# Patient Record
Sex: Female | Born: 1975
Health system: Southern US, Community
[De-identification: ages and names within clinical notes are randomized; demographics above are authoritative.]

## PROBLEM LIST (undated history)

## (undated) ENCOUNTER — Inpatient Hospital Stay (HOSPITAL_COMMUNITY): Payer: Self-pay

## (undated) DIAGNOSIS — L719 Rosacea, unspecified: Secondary | ICD-10-CM

## (undated) DIAGNOSIS — R011 Cardiac murmur, unspecified: Secondary | ICD-10-CM

## (undated) DIAGNOSIS — Z9189 Other specified personal risk factors, not elsewhere classified: Secondary | ICD-10-CM

## (undated) DIAGNOSIS — E349 Endocrine disorder, unspecified: Secondary | ICD-10-CM

## (undated) DIAGNOSIS — D219 Benign neoplasm of connective and other soft tissue, unspecified: Secondary | ICD-10-CM

## (undated) DIAGNOSIS — R87619 Unspecified abnormal cytological findings in specimens from cervix uteri: Secondary | ICD-10-CM

## (undated) DIAGNOSIS — K5792 Diverticulitis of intestine, part unspecified, without perforation or abscess without bleeding: Secondary | ICD-10-CM

## (undated) DIAGNOSIS — K8020 Calculus of gallbladder without cholecystitis without obstruction: Secondary | ICD-10-CM

## (undated) DIAGNOSIS — IMO0002 Reserved for concepts with insufficient information to code with codable children: Secondary | ICD-10-CM

## (undated) DIAGNOSIS — G43109 Migraine with aura, not intractable, without status migrainosus: Secondary | ICD-10-CM

## (undated) DIAGNOSIS — N979 Female infertility, unspecified: Secondary | ICD-10-CM

## (undated) DIAGNOSIS — N946 Dysmenorrhea, unspecified: Secondary | ICD-10-CM

## (undated) HISTORY — DX: Dysmenorrhea, unspecified: N94.6

## (undated) HISTORY — DX: Migraine with aura, not intractable, without status migrainosus: G43.109

## (undated) HISTORY — DX: Female infertility, unspecified: N97.9

## (undated) HISTORY — DX: Other specified personal risk factors, not elsewhere classified: Z91.89

## (undated) HISTORY — PX: WISDOM TOOTH EXTRACTION: SHX21

## (undated) HISTORY — DX: Endocrine disorder, unspecified: E34.9

## (undated) HISTORY — PX: LAPAROSCOPY: SHX197

## (undated) HISTORY — PX: ABDOMINAL HYSTERECTOMY: SHX81

## (undated) HISTORY — DX: Diverticulitis of intestine, part unspecified, without perforation or abscess without bleeding: K57.92

## (undated) HISTORY — PX: LAPAROSCOPIC OVARIAN CYSTECTOMY: SUR786

## (undated) HISTORY — PX: OTHER SURGICAL HISTORY: SHX169

## (undated) HISTORY — PX: LEEP: SHX91

## (undated) HISTORY — DX: Cardiac murmur, unspecified: R01.1

## (undated) HISTORY — PX: CERVICAL BIOPSY  W/ LOOP ELECTRODE EXCISION: SUR135

## (undated) HISTORY — PX: MYOMECTOMY: SHX85

## (undated) HISTORY — DX: Calculus of gallbladder without cholecystitis without obstruction: K80.20

## (undated) HISTORY — DX: Rosacea, unspecified: L71.9

## (undated) HISTORY — PX: CHOLECYSTECTOMY: SHX55

## (undated) HISTORY — PX: DILATION AND CURETTAGE OF UTERUS: SHX78

## (undated) HISTORY — PX: HYSTEROSCOPY: SHX211

---

## 2007-01-03 ENCOUNTER — Emergency Department (HOSPITAL_COMMUNITY): Admission: EM | Admit: 2007-01-03 | Discharge: 2007-01-03 | Payer: Self-pay | Admitting: Family Medicine

## 2007-01-09 ENCOUNTER — Other Ambulatory Visit: Admission: RE | Admit: 2007-01-09 | Discharge: 2007-01-09 | Payer: Self-pay | Admitting: Obstetrics and Gynecology

## 2007-08-08 ENCOUNTER — Ambulatory Visit (HOSPITAL_COMMUNITY): Admission: RE | Admit: 2007-08-08 | Discharge: 2007-08-08 | Payer: Self-pay | Admitting: Gastroenterology

## 2007-08-15 ENCOUNTER — Ambulatory Visit (HOSPITAL_COMMUNITY): Admission: RE | Admit: 2007-08-15 | Discharge: 2007-08-16 | Payer: Self-pay | Admitting: General Surgery

## 2007-08-15 ENCOUNTER — Encounter (INDEPENDENT_AMBULATORY_CARE_PROVIDER_SITE_OTHER): Payer: Self-pay | Admitting: General Surgery

## 2008-03-04 ENCOUNTER — Other Ambulatory Visit: Admission: RE | Admit: 2008-03-04 | Discharge: 2008-03-04 | Payer: Self-pay | Admitting: Obstetrics and Gynecology

## 2008-07-10 ENCOUNTER — Ambulatory Visit: Payer: Self-pay | Admitting: Internal Medicine

## 2009-07-13 ENCOUNTER — Ambulatory Visit: Payer: Self-pay | Admitting: Internal Medicine

## 2009-07-13 DIAGNOSIS — J069 Acute upper respiratory infection, unspecified: Secondary | ICD-10-CM | POA: Insufficient documentation

## 2010-04-18 ENCOUNTER — Emergency Department (HOSPITAL_COMMUNITY): Admission: EM | Admit: 2010-04-18 | Discharge: 2010-04-18 | Payer: Self-pay | Admitting: Emergency Medicine

## 2010-04-27 ENCOUNTER — Ambulatory Visit (HOSPITAL_COMMUNITY)
Admission: RE | Admit: 2010-04-27 | Discharge: 2010-04-27 | Payer: Self-pay | Source: Home / Self Care | Admitting: Obstetrics & Gynecology

## 2010-05-07 ENCOUNTER — Ambulatory Visit (HOSPITAL_COMMUNITY)
Admission: RE | Admit: 2010-05-07 | Discharge: 2010-05-07 | Payer: Self-pay | Source: Home / Self Care | Admitting: Obstetrics & Gynecology

## 2010-06-27 ENCOUNTER — Encounter: Payer: Self-pay | Admitting: Gastroenterology

## 2010-07-07 NOTE — Assessment & Plan Note (Signed)
Summary: COUGH, CONGESTION // RS   Vital Signs:  Patient profile:   35 year old female Weight:      194 pounds O2 Sat:      96 % on Room air Temp:     98.5 degrees F oral BP sitting:   92 / 60  (right arm) Cuff size:   regular  Vitals Entered By: Raechel Ache, RN (July 13, 2009 4:20 PM)  O2 Flow:  Room air CC: congestion - chest and back , wet productive cough   CC:  congestion - chest and back  and wet productive cough.  History of Present Illness: 35 year old patient who presents with a 3-day history of right ear discomfort.  Head and chest congestion, and mildly productive cough.  Denies any fever, chills, shortness of breath or chest pain cough is productive of slightly yellow tinged sputum.  Denies any wheezing.  Allergies: No Known Drug Allergies  Past History:  Past Medical History: Reviewed history from 07/10/2008 and no changes required. unremarkable  Review of Systems       The patient complains of anorexia and prolonged cough.  The patient denies weight loss, weight gain, vision loss, decreased hearing, hoarseness, chest pain, syncope, dyspnea on exertion, peripheral edema, headaches, hemoptysis, abdominal pain, melena, hematochezia, severe indigestion/heartburn, hematuria, incontinence, genital sores, muscle weakness, suspicious skin lesions, transient blindness, difficulty walking, depression, unusual weight change, abnormal bleeding, enlarged lymph nodes, angioedema, and breast masses.    Physical Exam  General:  overweight-appearing.  no distress.  Low-normal blood pressure Head:  Normocephalic and atraumatic without obvious abnormalities. No apparent alopecia or balding. Eyes:  No corneal or conjunctival inflammation noted. EOMI. Perrla. Funduscopic exam benign, without hemorrhages, exudates or papilledema. Vision grossly normal. Ears:  External ear exam shows no significant lesions or deformities.  Otoscopic examination reveals clear canals, tympanic  membranes are intact bilaterally without bulging, retraction, inflammation or discharge. Hearing is grossly normal bilaterally. Nose:  External nasal examination shows no deformity or inflammation. Nasal mucosa are pink and moist without lesions or exudates. Mouth:  Oral mucosa and oropharynx without lesions or exudates.  Teeth in good repair. Lungs:  Normal respiratory effort, chest expands symmetrically. Lungs are clear to auscultation, no crackles or wheezes. Heart:  Normal rate and regular rhythm. S1 and S2 normal without gallop, murmur, click, rub or other extra sounds.   Impression & Recommendations:  Problem # 1:  URI (ICD-465.9)  Her updated medication list for this problem includes:    Hydrocodone-homatropine 5-1.5 Mg/4ml Syrp (Hydrocodone-homatropine) .Marland Kitchen... 1 teaspoon every 6 hours as needed for cough or congestion  Complete Medication List: 1)  Nuvaring 0.12-0.015 Mg/24hr Ring (Etonogestrel-ethinyl estradiol) 2)  Hydrocodone-homatropine 5-1.5 Mg/27ml Syrp (Hydrocodone-homatropine) .Marland Kitchen.. 1 teaspoon every 6 hours as needed for cough or congestion  Patient Instructions: 1)  Get plenty of rest, drink lots of clear liquids, and use Tylenol or Ibuprofen for fever and comfort. Return in 7-10 days if you're not better:sooner if you're feeling worse. Prescriptions: HYDROCODONE-HOMATROPINE 5-1.5 MG/5ML SYRP (HYDROCODONE-HOMATROPINE) 1 teaspoon every 6 hours as needed for cough or congestion  #6 oz x 1   Entered and Authorized by:   Gordy Savers  MD   Signed by:   Gordy Savers  MD on 07/13/2009   Method used:   Print then Give to Patient   RxID:   [id number]

## 2010-08-17 LAB — CBC
HCT: 41.8 % (ref 36.0–46.0)
Hemoglobin: 14.3 g/dL (ref 12.0–15.0)
MCH: 29.9 pg (ref 26.0–34.0)
MCHC: 34.2 g/dL (ref 30.0–36.0)
RDW: 13 % (ref 11.5–15.5)

## 2010-08-17 LAB — RHOGAM INJECTION: Unit division: 0

## 2010-08-20 ENCOUNTER — Encounter (HOSPITAL_COMMUNITY)
Admission: RE | Admit: 2010-08-20 | Discharge: 2010-08-20 | Disposition: A | Discharge status: Home / Self Care | Payer: Managed Care, Other (non HMO) | Source: Ambulatory Visit | Attending: Obstetrics and Gynecology | Admitting: Obstetrics and Gynecology

## 2010-08-20 DIAGNOSIS — Z01812 Encounter for preprocedural laboratory examination: Secondary | ICD-10-CM | POA: Insufficient documentation

## 2010-08-20 LAB — CBC
Hemoglobin: 14.5 g/dL (ref 12.0–15.0)
Platelets: 189 10*3/uL (ref 150–400)
RBC: 5 MIL/uL (ref 3.87–5.11)
WBC: 8.4 10*3/uL (ref 4.0–10.5)

## 2010-08-20 LAB — SURGICAL PCR SCREEN
MRSA, PCR: NEGATIVE
Staphylococcus aureus: NEGATIVE

## 2010-08-27 ENCOUNTER — Ambulatory Visit (HOSPITAL_COMMUNITY)
Admission: RE | Admit: 2010-08-27 | Discharge: 2010-08-27 | Disposition: A | Discharge status: Home / Self Care | Payer: Managed Care, Other (non HMO) | Source: Ambulatory Visit | Attending: Obstetrics and Gynecology | Admitting: Obstetrics and Gynecology

## 2010-08-27 DIAGNOSIS — Z01818 Encounter for other preprocedural examination: Secondary | ICD-10-CM | POA: Insufficient documentation

## 2010-08-27 DIAGNOSIS — N949 Unspecified condition associated with female genital organs and menstrual cycle: Secondary | ICD-10-CM | POA: Insufficient documentation

## 2010-08-27 DIAGNOSIS — N938 Other specified abnormal uterine and vaginal bleeding: Secondary | ICD-10-CM | POA: Insufficient documentation

## 2010-08-27 DIAGNOSIS — N803 Endometriosis of pelvic peritoneum, unspecified: Secondary | ICD-10-CM | POA: Insufficient documentation

## 2010-08-27 DIAGNOSIS — D251 Intramural leiomyoma of uterus: Secondary | ICD-10-CM | POA: Insufficient documentation

## 2010-08-27 DIAGNOSIS — Z01812 Encounter for preprocedural laboratory examination: Secondary | ICD-10-CM | POA: Insufficient documentation

## 2010-08-27 LAB — PREGNANCY, URINE: Preg Test, Ur: NEGATIVE

## 2010-08-30 ENCOUNTER — Other Ambulatory Visit: Payer: Self-pay | Admitting: Obstetrics and Gynecology

## 2010-10-19 NOTE — Op Note (Signed)
Gabrielle Gilbert, Gabrielle Gilbert               ACCOUNT NO.:  [id number]   MEDICAL RECORD NO.:  [id number]          PATIENT TYPE:  INP   LOCATION:  5151                         FACILITY:  [facility]   PHYSICIAN:  Sharlet Salina T. Hoxworth, M.D.DATE OF BIRTH:  03-14-1976   DATE OF PROCEDURE:  08/15/2007  DATE OF DISCHARGE:  08/16/2007                               OPERATIVE REPORT   PREOPERATIVE DIAGNOSIS:  Cholelithiasis.   POSTOPERATIVE DIAGNOSIS:  Cholelithiasis.   SURGICAL PROCEDURE:  Laparoscopic cholecystectomy with intraoperative  cholangiogram.   SURGEON:  Sharlet Salina T. Hoxworth, M.D.   ANESTHESIA:  General.   BRIEF HISTORY:  [name] is a 35 year old female who presents with  gradually worsening episodic epigastric and right upper quadrant  abdominal pain.  She had a workup showing multiple gallstones.  She is  felt to have symptomatic cholelithiasis.  Laparoscopic cholecystectomy  with cholangiogram has been recommend and accepted.  The nature of the  procedure, its indications, risks of bleeding, infection, bile leak and  bile duct injury were discussed and understood.  She is now brought to  operating room for this procedure.   DESCRIPTION OF OPERATION:  The patient was brought to the operating room  and placed in supine position on the operating table and general  endotracheal anesthesia was induced.  She received preoperative IV  antibiotics.  The abdomen was widely sterilely prepped and draped.  Correct patient and procedure were verified.  Local anesthesia was used  to infiltrate the trocar sites.  A 1-cm incision was made at the  umbilicus and dissection carried down to the midline fascia, which was  sharply incised for 1 cm and the peritoneum entered under direct vision.  Through a mattress suture of 0 Vicryl, the Hasson trocar was placed and  pneumoperitoneum established.  Under direct vision an 11-mm trocar was  placed in subxiphoid area and two 5-mm trocars on the right  subcostal  margin.  The gallbladder was visualized and did not appear acutely  inflamed.  The fundus was grasped and elevated up over the liver and the  infundibulum retracted inferolaterally.  Peritoneum anterior posterior  to Calot's triangle was incised and fibrofatty tissue was stripped off  the neck of the gallbladder toward the porta hepatis.  The distal  gallbladder and Calot's triangle were thoroughly dissected.  The cystic  duct was identified and the cystic duct dissected 360 agrees at its  junction with the gallbladder.  The cystic artery was clearly identified  in Calot's triangle.  When the anatomy was clear, the cystic duct was  clipped at the gallbladder junction and an operative cholangiogram was  obtained through the cystic duct with good visualization of the common  bile duct and intrahepatic ducts with free flow into the duodenum and no  filling defects.  Following this, the cholangiocatheter was removed, the  cystic duct triply clipped proximally and divided.  The cystic artery  was further clipped proximally and distally and divided.  The  gallbladder then dissected free from its bed using hook cautery and  removed through the umbilicus.  Complete hemostasis was  assured.  Trocars  removed under direct vision and all CO2 evacuated.  The mattress suture was secured to the umbilicus.  Skin incisions were  closed with subcuticular Monocryl and Dermabond.  Sponge, instrument and  needle counts were correct.  The patient was taken to the recovery room  in good condition.      Lorne Skeens. Hoxworth, M.D.  Electronically Signed     BTH/MEDQ  D:  08/15/2007  T:  08/16/2007  Job:  [id number]

## 2011-01-07 NOTE — Op Note (Addendum)
Gabrielle Gilbert, ABEND               ACCOUNT NO.:  [id number]  MEDICAL RECORD NO.:  [id number]           PATIENT TYPE:  O  LOCATION:  SDC                           FACILITY:  [facility]  PHYSICIAN:  Fermin Schwab, MD   DATE OF BIRTH:  27-Jan-1976  DATE OF PROCEDURE:  08/27/2010 DATE OF DISCHARGE:  08/20/2010                              OPERATIVE REPORT   PREOPERATIVE DIAGNOSES:  Transmural myoma, 5 cm abnormal uterine bleeding, recurrent pregnancy loss.  POSTOPERATIVE DIAGNOSES:  Transmural 5-cm myoma plus subserosal myoma, abnormal uterine bleeding, and recurrent pregnancy loss, stage I endometriosis and pelvic peritoneum.  PROCEDURE:  Laparoscopy, robotic assistance with myomectomy, excision and ablation of endometriosis.  SURGEON:  Fermin Schwab, MD  ASSISTANT:  Lum Keas, MD  FINDINGS:  On exam under anesthesia, the uterus was enlarged to 9-10 weeks size and was firm and mobile.  No adnexal masses were palpable.On laparoscopy, there were transverse colon adhesions to the liver edge. The liver itself was normal.  Diaphragm was normal.  The appendix appeared normal.  There was left iliac fossa, brown lesion of endometriosis as well as a constellation of stellate and brown lesions in the left side of the anterior cul-de-sac that were deep.  These were excised.  There were also posterior cul-de-sac brown lesions and clear lesions which were also ablated.  Both tubes appeared normal.  The uterus contained a fundal anterior 5 cm myoma with an attached 2 x 1 cm subserosal myoma.  There were other less than 1 cm subserosal myomas on the anterior surface of the uterus.  Both fallopian tubes appeared normal proximally and distally with healthy fimbria.  On chromotubation, the tubes were filled with dye and slowly passed slowly spilled.  Both ovaries appeared grossly normal.  The uterus sounded to 9 cm.  DESCRIPTION OF PROCEDURE:  The patient was placed in dorsal  supine position.  General anesthesia was given, 1 gram of Kefzol was given intravenously for prophylaxis.  She was prepped and draped in sterile manner in lithotomy position and a Foley catheter was inserted into the bladder.  A uterus sounded to 9 cm and Zumi manipulator was inserted into the uterus.  As balloon was inflated, this was used for uterine manipulation and chromotubation during the procedure.  A supraumbilical 12-mm skin incision was made with 2 incisions to the left that were 9 cm apart and below the level of this incision, each of which were 8 mm. Another incision was made symmetrically to the right of midline. Finally a fifth incision, 12 mm long was made in the right lower quadrant for accessory ports.  A Veress needle was inserted into the supraumbilical incision and its correct location was verified and pneumoperitoneum was created with CO2.  A 12-mm trocar was inserted and video laparoscopy was started.  The rest of the trocars were inserted under laparoscopic guidance.  The patient was placed in lithotomy and Trendelenburg position and the [employer] S robot was docked from the left side of the patient.  A tenaculum was attached to arm #3 and bipolar graspers were placed in #2 and monopolar scissors  were attached to arm #1.  A dilute vasopressin was injected into the myometrium until the uterus blanched (0.33 unit per mL x15 mL).  Anterior elliptical incision was made on the protruding aspect of the large myoma and rest of the myoma was carefully dissected and was noted to be adjacent to the anterior endometrium.  The endometrium was not entered.  The resulting defect was closed in 4 layers, first layer was a continuous interlocking suture of 2-0 PDS, second layer was a continuous suture of 2-0 PDS.  The gaping area of the superficial myometrium was closed with figure-of-eight and continuous suture of 3-0 PDS.  A continuous suture of 4-0 Vicryl was placed on the  serosa.  Adequate hemostasis was ensured.  The anterior cul-de-sac cluster of lesions were excised carefully with monopolar scissors and the rest of the endometriotic lesions were ablated with tip of monopolar scissors. Chromotubation ensured endometrial integrity of the interstitial portion of the tubes during the closure uterine repair process.  Estimated blood loss was 20 mL.  The probe out was undocked from the patient.  The instruments were removed.  The instrument count was correct.  Using a Lendell Caprice morcellator, the myoma was morcellated and pieces were removed.  The pelvis was copiously irrigated with lactated Ringer's and aspirated.  A slurry of Seprafilm (2 sheath and 40 mL) was instilled into the pelvis.  Large incisions were approximated with 0 Vicryl figure-of-eight sutures.  The subcutaneous layer of the 2 larger incisions were approximated with 4-0 Monocryl.  The skin was approximated with Dermabond.  Estimated blood loss was 28 mL.  The patient tolerated the procedure well and was transferred to recovery room in satisfactory condition.     Fermin Schwab, MD     TY/MEDQ  D:  08/27/2010  T:  08/28/2010  Job:  [id number]  cc:   M. Leda Quail, MD Fax: [phone]  Electronically Signed by Fermin Schwab MD on 01/07/2011 04:10:55 PM

## 2011-02-28 LAB — COMPREHENSIVE METABOLIC PANEL
ALT: 17
AST: 23
CO2: 26
Chloride: 111
Creatinine, Ser: 0.94
GFR calc Af Amer: >60
GFR calc non Af Amer: >60
Glucose, Bld: 91
Sodium: 140
Total Bilirubin: 0.7

## 2011-02-28 LAB — CBC
Hemoglobin: 11.4 — ABNORMAL LOW
MCV: 78.2
MCV: 78.6
Platelets: 206
RBC: 4.25
WBC: 6.3
WBC: 9.9

## 2011-02-28 LAB — DIFFERENTIAL
Eosinophils Absolute: 0.1
Lymphocytes Relative: 30
Lymphs Abs: 1.9
Neutro Abs: 3.8
Neutrophils Relative %: 61

## 2011-03-04 ENCOUNTER — Other Ambulatory Visit (HOSPITAL_COMMUNITY): Payer: Self-pay | Admitting: Obstetrics and Gynecology

## 2011-03-04 DIAGNOSIS — N979 Female infertility, unspecified: Secondary | ICD-10-CM

## 2011-03-09 ENCOUNTER — Ambulatory Visit (HOSPITAL_COMMUNITY): Admission: RE | Admit: 2011-03-09 | Payer: Managed Care, Other (non HMO) | Source: Ambulatory Visit

## 2011-03-14 ENCOUNTER — Ambulatory Visit (HOSPITAL_COMMUNITY): Payer: Managed Care, Other (non HMO)

## 2011-03-14 ENCOUNTER — Ambulatory Visit (HOSPITAL_COMMUNITY): Admission: RE | Admit: 2011-03-14 | Payer: Managed Care, Other (non HMO) | Source: Ambulatory Visit

## 2011-03-21 LAB — POCT URINALYSIS DIP (DEVICE)
Nitrite: NEGATIVE
Protein, ur: NEGATIVE
pH: 6

## 2011-03-21 LAB — WET PREP, GENITAL

## 2011-05-12 LAB — OB RESULTS CONSOLE HIV ANTIBODY (ROUTINE TESTING): HIV: NONREACTIVE

## 2011-05-12 LAB — OB RESULTS CONSOLE RPR: RPR: NONREACTIVE

## 2011-05-12 LAB — OB RESULTS CONSOLE ABO/RH: RH Type: NEGATIVE

## 2011-05-24 ENCOUNTER — Encounter (HOSPITAL_COMMUNITY): Payer: Self-pay

## 2011-05-24 ENCOUNTER — Inpatient Hospital Stay (HOSPITAL_COMMUNITY)
Admission: AD | Admit: 2011-05-24 | Discharge: 2011-05-24 | Disposition: A | Discharge status: Home / Self Care | Payer: Managed Care, Other (non HMO) | Source: Ambulatory Visit | Attending: Obstetrics and Gynecology | Admitting: Obstetrics and Gynecology

## 2011-05-24 ENCOUNTER — Inpatient Hospital Stay (HOSPITAL_COMMUNITY): Payer: Managed Care, Other (non HMO)

## 2011-05-24 DIAGNOSIS — O200 Threatened abortion: Secondary | ICD-10-CM | POA: Insufficient documentation

## 2011-05-24 HISTORY — DX: Unspecified abnormal cytological findings in specimens from cervix uteri: R87.619

## 2011-05-24 HISTORY — DX: Reserved for concepts with insufficient information to code with codable children: IMO0002

## 2011-05-24 HISTORY — DX: Benign neoplasm of connective and other soft tissue, unspecified: D21.9

## 2011-05-24 LAB — POCT PREGNANCY, URINE: Preg Test, Ur: POSITIVE

## 2011-05-24 LAB — URINALYSIS, ROUTINE W REFLEX MICROSCOPIC
Nitrite: NEGATIVE
Specific Gravity, Urine: 1.025 (ref 1.005–1.030)
Urobilinogen, UA: 0.2 mg/dL (ref 0.0–1.0)

## 2011-05-24 LAB — URINE MICROSCOPIC-ADD ON

## 2011-05-24 MED ORDER — RHO D IMMUNE GLOBULIN 1500 UNIT/2ML IJ SOLN
300.0000 ug | Freq: Once | INTRAMUSCULAR | Status: AC
  Administered 2011-05-24: 300 ug via INTRAMUSCULAR
  Filled 2011-05-24: qty 2

## 2011-05-24 NOTE — Progress Notes (Signed)
Pt states awoke this am, thought she was voiding in bed, noted blood. In toilet, passed large clot. Has had same pad on since then, bleeding slowed down. Last intercourse Thursday. Denies abnormal vaginal discharge. Hx prior miscarriages.

## 2011-05-24 NOTE — ED Provider Notes (Signed)
History     Chief Complaint  Patient presents with  . Vaginal Bleeding   HPI 35 yo [id number] MWF hx recurrent preg loss now @ 11 4/[redacted] wk gestation presents for evaluation of passage of clot and vaginal bleeding this am. Pt had episode of cramping x 1 . No recent intercourse. S/p myomectomy. On antibiotis for URI  OB History    Grav Para Term Preterm Abortions TAB SAB Ect Mult Living   5 1 1  0 3 0 3 0 0 1      Past Medical History  Diagnosis Date  . Fibroid   . Endometriosis   . Abnormal Pap smear     Past Surgical History  Procedure Date  . Myomectomy   . Dilation and curettage of uterus   . Cholecystectomy   . Laparoscopy   . Laparoscopic ovarian cystectomy   . Leep   . Wisdom tooth extraction     Family History  Problem Relation Age of Onset  . Anesthesia problems Neg Hx     History  Substance Use Topics  . Smoking status: Never Smoker   . Smokeless tobacco: Never Used  . Alcohol Use: No    Allergies: No Known Allergies  Prescriptions prior to admission  Medication Sig Dispense Refill  . acetaminophen (TYLENOL) 325 MG tablet Take 650 mg by mouth every 6 (six) hours as needed. For pain       . azithromycin (ZITHROMAX) 250 MG tablet Take 250-500 mg by mouth daily. Take 2 tablets on the first day, then take 1 tablet daily for the next 4 days       . prenatal vitamin w/FE, FA (PRENATAL 1 + 1) 27-1 MG TABS Take 1 tablet by mouth daily.          ROS neg except HPI Physical Exam   Blood pressure 112/54, pulse 60, temperature 98 F (36.7 C), temperature source Oral, resp. rate 16, height 5' 0.5" (1.537 m), weight 77.678 kg (171 lb 4 oz), last menstrual period 03/04/2011.  Physical Exam WD WN F in NAD Lungs clear to A Cor RRR ABD; Soft nontender healed upper abd scar Pelvic: Vulva - no blood Vaginal; small amt of scant dark red blood w/ small clot at os Cervix (+) ectropian closed/firm/long Uterus gravid 12 wk size (+) FHR Adnexa: nonpalp  MAU Course    Procedures ultrasound  MDM   Assessment and Plan  Threatened AB Recurrent preg loss (hx) IUP @ 11 4/7 weeks Rh neg P) OB sono. RHIG w/u. Rhophylac . Threatened AB prec. If ultrasound ok, keep sched OB appt 12/19  Dusti Tetro A 05/24/2011, 8:41 AM

## 2011-05-25 LAB — RH IG WORKUP (INCLUDES ABO/RH): Unit division: 0

## 2011-10-18 ENCOUNTER — Other Ambulatory Visit: Payer: Self-pay

## 2011-10-21 ENCOUNTER — Ambulatory Visit (HOSPITAL_COMMUNITY)
Admission: RE | Admit: 2011-10-21 | Discharge: 2011-10-21 | Disposition: A | Discharge status: Home / Self Care | Payer: Managed Care, Other (non HMO) | Source: Ambulatory Visit | Attending: Obstetrics and Gynecology | Admitting: Obstetrics and Gynecology

## 2011-11-06 ENCOUNTER — Encounter (HOSPITAL_COMMUNITY): Payer: Self-pay | Admitting: Obstetrics and Gynecology

## 2011-11-06 ENCOUNTER — Inpatient Hospital Stay (HOSPITAL_COMMUNITY)
Admission: AD | Admit: 2011-11-06 | Discharge: 2011-11-06 | Disposition: A | Discharge status: Home / Self Care | Payer: Managed Care, Other (non HMO) | Source: Ambulatory Visit | Attending: Obstetrics & Gynecology | Admitting: Obstetrics & Gynecology

## 2011-11-06 DIAGNOSIS — O4700 False labor before 37 completed weeks of gestation, unspecified trimester: Secondary | ICD-10-CM | POA: Insufficient documentation

## 2011-11-06 LAB — URINALYSIS, ROUTINE W REFLEX MICROSCOPIC
Leukocytes, UA: NEGATIVE
Nitrite: NEGATIVE
Protein, ur: NEGATIVE mg/dL
Specific Gravity, Urine: 1.01 (ref 1.005–1.030)
Urobilinogen, UA: 0.2 mg/dL (ref 0.0–1.0)

## 2011-11-06 MED ORDER — TERBUTALINE SULFATE 1 MG/ML IJ SOLN
0.2500 mg | Freq: Once | INTRAMUSCULAR | Status: AC
  Administered 2011-11-06: 0.25 mg via SUBCUTANEOUS
  Filled 2011-11-06: qty 1

## 2011-11-06 NOTE — Discharge Instructions (Signed)
Preterm labor precautions

## 2011-11-06 NOTE — Progress Notes (Signed)
Dr. Juliene Pina notified of patient arrival with complaints of abdominal cramping and brown vaginal discharge. Pt is 35w2 days; planned primary c-section. Tracing described to Dr. Juliene Pina with UI, lasting 10-20 secs. Orders received will notify Dr. Cherly Hensen that patient is here; Dr. Cherly Hensen is primary.

## 2011-11-06 NOTE — Progress Notes (Signed)
Dr. Cherly Hensen notified of patient arrival with complaints of lower abdominal pain and brown vaginal discharge. Dr. Katherine Basset with Dr. Camillia Herter plan of care.

## 2011-11-06 NOTE — MAU Provider Note (Signed)
Reviewed and agree with note V.Donney Caraveo, MD 

## 2011-11-06 NOTE — MAU Provider Note (Signed)
  History     CSN: [id number]  Arrival date and time: 11/06/11 0800 Call request to see patient - 0845  First Provider Initiated Contact with Patient 11/06/11 [phone]      Chief Complaint  Patient presents with  . Abdominal Cramping  . Vaginal Discharge   HPI  Ctx intermittently x 3-4 days / more vaginal pressure / NO LOF Some brown discharge since last week / + after intercourse Active FM  Past Medical History  Diagnosis Date  . Fibroid   . Endometriosis   . Abnormal Pap smear     Past Surgical History  Procedure Date  . Myomectomy   . Dilation and curettage of uterus   . Cholecystectomy   . Laparoscopy   . Laparoscopic ovarian cystectomy   . Leep   . Wisdom tooth extraction     Family History  Problem Relation Age of Onset  . Anesthesia problems Neg Hx     History  Substance Use Topics  . Smoking status: Never Smoker   . Smokeless tobacco: Never Used  . Alcohol Use: No    Allergies: No Known Allergies  Prescriptions prior to admission  Medication Sig Dispense Refill  . Prenatal Vit-Fe Fumarate-FA (PRENATAL MULTIVITAMIN) TABS Take 1 tablet by mouth every morning.        ROS Physical Exam   Blood pressure 119/66, pulse 81, temperature 97.2 F (36.2 C), temperature source Oral, resp. rate 18, last menstrual period 03/04/2011.  Physical Exam  Alert and oriented / NAD HR mild tachycardia secondary to terbutaline Abdomen soft / non-distended VE: closed / soft / >50% / vtx / -3 and ballotable  Urinalysis - negative  FHR category 1  Toco: no ctx since terbutaline injection  MAU Course  Procedures  Assessment and Plan  35 2/[redacted] weeks gestation Threatened PTL versus braxton-hicks No evidence of preterm labor HX myomectomy - planned CS  1) discharge home 2) labor precautions 3) ROB Tuesday as scheduled  Marlinda Mike 11/06/2011, 9:34 AM

## 2011-11-06 NOTE — MAU Note (Signed)
Pt presents to MAU with chief complaint of abdominal cramping and brown discharge that she noticed this morning. Pt is [redacted]w[redacted]d; G5P1, planned primary C-section for history of myomectomy (2012).

## 2011-11-17 ENCOUNTER — Other Ambulatory Visit: Payer: Self-pay | Admitting: Obstetrics and Gynecology

## 2011-11-25 ENCOUNTER — Encounter (HOSPITAL_COMMUNITY): Payer: Self-pay | Admitting: Pharmacist

## 2011-11-28 ENCOUNTER — Encounter (HOSPITAL_COMMUNITY): Payer: Self-pay | Admitting: *Deleted

## 2011-11-29 ENCOUNTER — Encounter (HOSPITAL_COMMUNITY): Payer: Self-pay

## 2011-11-29 ENCOUNTER — Encounter (HOSPITAL_COMMUNITY)
Admission: RE | Admit: 2011-11-29 | Discharge: 2011-11-29 | Disposition: A | Discharge status: Home / Self Care | Payer: Managed Care, Other (non HMO) | Source: Ambulatory Visit | Attending: Obstetrics and Gynecology | Admitting: Obstetrics and Gynecology

## 2011-11-29 LAB — CBC
HCT: 38.7 % (ref 36.0–46.0)
Hemoglobin: 13.7 g/dL (ref 12.0–15.0)
MCH: 30.5 pg (ref 26.0–34.0)
MCHC: 35.4 g/dL (ref 30.0–36.0)
MCV: 86.2 fL (ref 78.0–100.0)
Platelets: 156 10*3/uL (ref 150–400)
RBC: 4.49 MIL/uL (ref 3.87–5.11)
RDW: 13.4 % (ref 11.5–15.5)
WBC: 10.9 10*3/uL — ABNORMAL HIGH (ref 4.0–10.5)

## 2011-11-29 LAB — TYPE AND SCREEN: DAT, IgG: NEGATIVE

## 2011-11-29 LAB — SYPHILIS: RPR W/REFLEX TO RPR TITER AND TREPONEMAL ANTIBODIES, TRADITIONAL SCREENING AND DIAGNOSIS ALGORITHM: RPR Ser Ql: NONREACTIVE

## 2011-11-29 NOTE — Patient Instructions (Addendum)
20 Gabrielle Gilbert  11/29/2011   Your procedure is scheduled on:  12/02/11  Enter through the Main Entrance of [facility] at 6 AM.  Pick up the phone at the desk and dial [phone].   Call this number if you have problems the morning of surgery: [phone]   Remember:   Do not eat food:After Midnight.  Do not drink clear liquids: After Midnight.  Take these medicines the morning of surgery with A SIP OF WATER: NA    Do not wear jewelry, make-up or nail polish.  Do not wear lotions, powders, or perfumes. You may wear deodorant.  Do not shave 48 hours prior to surgery.  Do not bring valuables to the hospital.  Contacts, dentures or bridgework may not be worn into surgery.  Leave suitcase in the car. After surgery it may be brought to your room.  For patients admitted to the hospital, checkout time is 11:00 AM the day of discharge.   Patients discharged the day of surgery will not be allowed to drive home.  Name and phone number of your driver: NA  Special Instructions: CHG Shower Use Special Wash: 1/2 bottle night before surgery and 1/2 bottle morning of surgery.   Please read over the following fact sheets that you were given: MRSA Information

## 2011-12-01 MED ORDER — DEXTROSE 5 % IV SOLN
2.0000 g | INTRAVENOUS | Status: AC
  Administered 2011-12-02: 2 g via INTRAVENOUS
  Filled 2011-12-01: qty 2

## 2011-12-02 ENCOUNTER — Encounter (HOSPITAL_COMMUNITY): Payer: Self-pay | Admitting: *Deleted

## 2011-12-02 ENCOUNTER — Inpatient Hospital Stay (HOSPITAL_COMMUNITY): Payer: Managed Care, Other (non HMO) | Admitting: Anesthesiology

## 2011-12-02 ENCOUNTER — Inpatient Hospital Stay (HOSPITAL_COMMUNITY)
Admission: RE | Admit: 2011-12-02 | Discharge: 2011-12-04 | DRG: 765 | Disposition: A | Discharge status: Home / Self Care | Payer: Managed Care, Other (non HMO) | Source: Ambulatory Visit | Attending: Obstetrics and Gynecology | Admitting: Obstetrics and Gynecology

## 2011-12-02 ENCOUNTER — Encounter (HOSPITAL_COMMUNITY): Payer: Self-pay | Admitting: Anesthesiology

## 2011-12-02 ENCOUNTER — Encounter (HOSPITAL_COMMUNITY)
Admission: RE | Disposition: A | Discharge status: Home / Self Care | Payer: Self-pay | Source: Ambulatory Visit | Attending: Obstetrics and Gynecology

## 2011-12-02 DIAGNOSIS — Z01812 Encounter for preprocedural laboratory examination: Secondary | ICD-10-CM

## 2011-12-02 DIAGNOSIS — O34599 Maternal care for other abnormalities of gravid uterus, unspecified trimester: Secondary | ICD-10-CM | POA: Diagnosis present

## 2011-12-02 DIAGNOSIS — O3490 Maternal care for abnormality of pelvic organ, unspecified, unspecified trimester: Principal | ICD-10-CM | POA: Diagnosis present

## 2011-12-02 DIAGNOSIS — D4959 Neoplasm of unspecified behavior of other genitourinary organ: Secondary | ICD-10-CM | POA: Diagnosis present

## 2011-12-02 DIAGNOSIS — D62 Acute posthemorrhagic anemia: Secondary | ICD-10-CM | POA: Diagnosis not present

## 2011-12-02 DIAGNOSIS — O9903 Anemia complicating the puerperium: Secondary | ICD-10-CM | POA: Diagnosis not present

## 2011-12-02 DIAGNOSIS — D252 Subserosal leiomyoma of uterus: Secondary | ICD-10-CM | POA: Diagnosis present

## 2011-12-02 LAB — TYPE AND SCREEN: Antibody Screen: POSITIVE

## 2011-12-02 SURGERY — Surgical Case
Anesthesia: Spinal | Site: Abdomen | Wound class: Clean Contaminated

## 2011-12-02 MED ORDER — FENTANYL CITRATE 0.05 MG/ML IJ SOLN
INTRAMUSCULAR | Status: AC
  Filled 2011-12-02: qty 2

## 2011-12-02 MED ORDER — DIBUCAINE 1 % RE OINT: 1.0000 "application " | TOPICAL_OINTMENT | RECTAL | Status: DC | PRN

## 2011-12-02 MED ORDER — IBUPROFEN 600 MG PO TABS: 600.0000 mg | ORAL_TABLET | Freq: Four times a day (QID) | ORAL | Status: DC | PRN

## 2011-12-02 MED ORDER — KETOROLAC TROMETHAMINE 30 MG/ML IJ SOLN
30.0000 mg | Freq: Four times a day (QID) | INTRAMUSCULAR | Status: DC | PRN
  Administered 2011-12-02: 30 mg via INTRAVENOUS
  Filled 2011-12-02: qty 1

## 2011-12-02 MED ORDER — EPHEDRINE SULFATE 50 MG/ML IJ SOLN: INTRAMUSCULAR | Status: DC | PRN

## 2011-12-02 MED ORDER — TETANUS-DIPHTH-ACELL PERTUSSIS 5-2.5-18.5 LF-MCG/0.5 IM SUSP
0.5000 mL | Freq: Once | INTRAMUSCULAR | Status: AC
  Administered 2011-12-03: 0.5 mL via INTRAMUSCULAR
  Filled 2011-12-02: qty 0.5

## 2011-12-02 MED ORDER — PHENYLEPHRINE 40 MCG/ML (10ML) SYRINGE FOR IV PUSH (FOR BLOOD PRESSURE SUPPORT)
PREFILLED_SYRINGE | INTRAVENOUS | Status: AC
  Filled 2011-12-02: qty 15

## 2011-12-02 MED ORDER — OXYTOCIN 10 UNIT/ML IJ SOLN
INTRAMUSCULAR | Status: AC
  Filled 2011-12-02: qty 4

## 2011-12-02 MED ORDER — NALBUPHINE HCL 10 MG/ML IJ SOLN
5.0000 mg | INTRAMUSCULAR | Status: DC | PRN
  Administered 2011-12-02: 10 mg via SUBCUTANEOUS
  Filled 2011-12-02 (×2): qty 1

## 2011-12-02 MED ORDER — BUPIVACAINE HCL (PF) 0.25 % IJ SOLN
INTRAMUSCULAR | Status: AC
  Filled 2011-12-02: qty 30

## 2011-12-02 MED ORDER — SODIUM BICARBONATE 8.4 % IV SOLN: INTRAVENOUS | Status: DC | PRN

## 2011-12-02 MED ORDER — PHENYLEPHRINE HCL 10 MG/ML IJ SOLN
INTRAMUSCULAR | Status: DC | PRN
  Administered 2011-12-02 (×2): 80 ug via INTRAVENOUS
  Administered 2011-12-02: 120 ug via INTRAVENOUS
  Administered 2011-12-02 (×3): 80 ug via INTRAVENOUS

## 2011-12-02 MED ORDER — OXYCODONE-ACETAMINOPHEN 5-325 MG PO TABS
1.0000 | ORAL_TABLET | ORAL | Status: DC | PRN
  Filled 2011-12-02: qty 1

## 2011-12-02 MED ORDER — BUPIVACAINE IN DEXTROSE 0.75-8.25 % IT SOLN
INTRATHECAL | Status: DC | PRN
  Administered 2011-12-02: 1.5 mL via INTRATHECAL

## 2011-12-02 MED ORDER — SCOPOLAMINE 1 MG/3DAYS TD PT72
1.0000 | MEDICATED_PATCH | TRANSDERMAL | Status: DC
  Administered 2011-12-02: 1.5 mg via TRANSDERMAL

## 2011-12-02 MED ORDER — EPHEDRINE 5 MG/ML INJ
INTRAVENOUS | Status: AC
  Filled 2011-12-02: qty 10

## 2011-12-02 MED ORDER — POLYSACCHARIDE IRON COMPLEX 150 MG PO CAPS
150.0000 mg | ORAL_CAPSULE | Freq: Two times a day (BID) | ORAL | Status: DC
  Administered 2011-12-02 – 2011-12-04 (×4): 150 mg via ORAL
  Filled 2011-12-02 (×7): qty 1

## 2011-12-02 MED ORDER — PHENYLEPHRINE 40 MCG/ML (10ML) SYRINGE FOR IV PUSH (FOR BLOOD PRESSURE SUPPORT)
PREFILLED_SYRINGE | INTRAVENOUS | Status: AC
  Filled 2011-12-02: qty 5

## 2011-12-02 MED ORDER — ONDANSETRON HCL 4 MG/2ML IJ SOLN: 4.0000 mg | INTRAMUSCULAR | Status: DC | PRN

## 2011-12-02 MED ORDER — ONDANSETRON HCL 4 MG/2ML IJ SOLN: 4.0000 mg | Freq: Three times a day (TID) | INTRAMUSCULAR | Status: DC | PRN

## 2011-12-02 MED ORDER — LANOLIN HYDROUS EX OINT: 1.0000 "application " | TOPICAL_OINTMENT | CUTANEOUS | Status: DC | PRN

## 2011-12-02 MED ORDER — KETOROLAC TROMETHAMINE 60 MG/2ML IM SOLN
INTRAMUSCULAR | Status: AC
  Administered 2011-12-02: 60 mg via INTRAMUSCULAR
  Filled 2011-12-02: qty 2

## 2011-12-02 MED ORDER — OXYTOCIN 40 UNITS IN LACTATED RINGERS INFUSION - SIMPLE MED: 62.5000 mL/h | INTRAVENOUS | Status: AC

## 2011-12-02 MED ORDER — NALOXONE HCL 0.4 MG/ML IJ SOLN: 0.4000 mg | INTRAMUSCULAR | Status: DC | PRN

## 2011-12-02 MED ORDER — DIPHENHYDRAMINE HCL 25 MG PO CAPS: 25.0000 mg | ORAL_CAPSULE | ORAL | Status: DC | PRN

## 2011-12-02 MED ORDER — OXYTOCIN 10 UNIT/ML IJ SOLN
40.0000 [IU] | INTRAVENOUS | Status: DC | PRN
  Administered 2011-12-02: 40 [IU] via INTRAVENOUS

## 2011-12-02 MED ORDER — KETOROLAC TROMETHAMINE 30 MG/ML IJ SOLN: 30.0000 mg | Freq: Four times a day (QID) | INTRAMUSCULAR | Status: DC | PRN

## 2011-12-02 MED ORDER — FLEET ENEMA 7-19 GM/118ML RE ENEM: 1.0000 | ENEMA | Freq: Every day | RECTAL | Status: DC | PRN

## 2011-12-02 MED ORDER — DIPHENHYDRAMINE HCL 25 MG PO CAPS: 25.0000 mg | ORAL_CAPSULE | Freq: Four times a day (QID) | ORAL | Status: DC | PRN

## 2011-12-02 MED ORDER — HYDROMORPHONE HCL PF 1 MG/ML IJ SOLN: 0.2500 mg | INTRAMUSCULAR | Status: DC | PRN

## 2011-12-02 MED ORDER — BISACODYL 10 MG RE SUPP: 10.0000 mg | Freq: Every day | RECTAL | Status: DC | PRN

## 2011-12-02 MED ORDER — METOCLOPRAMIDE HCL 5 MG/ML IJ SOLN: 10.0000 mg | Freq: Three times a day (TID) | INTRAMUSCULAR | Status: DC | PRN

## 2011-12-02 MED ORDER — BUPIVACAINE HCL (PF) 0.25 % IJ SOLN
INTRAMUSCULAR | Status: DC | PRN
  Administered 2011-12-02: 5 mL

## 2011-12-02 MED ORDER — ONDANSETRON HCL 4 MG PO TABS: 4.0000 mg | ORAL_TABLET | ORAL | Status: DC | PRN

## 2011-12-02 MED ORDER — LACTATED RINGERS IV SOLN
INTRAVENOUS | Status: DC
  Administered 2011-12-02 (×2): via INTRAVENOUS

## 2011-12-02 MED ORDER — SIMETHICONE 80 MG PO CHEW
80.0000 mg | CHEWABLE_TABLET | Freq: Three times a day (TID) | ORAL | Status: DC
  Administered 2011-12-02 – 2011-12-04 (×6): 80 mg via ORAL

## 2011-12-02 MED ORDER — MORPHINE SULFATE (PF) 0.5 MG/ML IJ SOLN
INTRAMUSCULAR | Status: DC | PRN
  Administered 2011-12-02: .1 mg via INTRATHECAL

## 2011-12-02 MED ORDER — NALBUPHINE HCL 10 MG/ML IJ SOLN
5.0000 mg | INTRAMUSCULAR | Status: DC | PRN
  Administered 2011-12-02 (×2): 10 mg via INTRAVENOUS
  Filled 2011-12-02 (×2): qty 1

## 2011-12-02 MED ORDER — ONDANSETRON HCL 4 MG/2ML IJ SOLN
INTRAMUSCULAR | Status: DC | PRN
  Administered 2011-12-02 (×2): 2 mg via INTRAVENOUS

## 2011-12-02 MED ORDER — GLYCOPYRROLATE 0.2 MG/ML IJ SOLN
INTRAMUSCULAR | Status: DC | PRN
  Administered 2011-12-02: 0.2 mg via INTRAVENOUS

## 2011-12-02 MED ORDER — LACTATED RINGERS IV SOLN
INTRAVENOUS | Status: DC | PRN
  Administered 2011-12-02 (×2): via INTRAVENOUS

## 2011-12-02 MED ORDER — ONDANSETRON HCL 4 MG/2ML IJ SOLN
INTRAMUSCULAR | Status: AC
  Filled 2011-12-02: qty 2

## 2011-12-02 MED ORDER — NALOXONE HCL 0.4 MG/ML IJ SOLN
1.0000 ug/kg/h | INTRAMUSCULAR | Status: DC | PRN
  Filled 2011-12-02: qty 2.5

## 2011-12-02 MED ORDER — FENTANYL CITRATE 0.05 MG/ML IJ SOLN
INTRAMUSCULAR | Status: DC | PRN
  Administered 2011-12-02: 25 ug via INTRATHECAL

## 2011-12-02 MED ORDER — ZOLPIDEM TARTRATE 5 MG PO TABS: 5.0000 mg | ORAL_TABLET | Freq: Every evening | ORAL | Status: DC | PRN

## 2011-12-02 MED ORDER — NALBUPHINE SYRINGE 5 MG/0.5 ML
INJECTION | INTRAMUSCULAR | Status: AC
  Administered 2011-12-02: 10 mg via SUBCUTANEOUS
  Filled 2011-12-02: qty 1

## 2011-12-02 MED ORDER — SCOPOLAMINE 1 MG/3DAYS TD PT72: 1.0000 | MEDICATED_PATCH | Freq: Once | TRANSDERMAL | Status: DC

## 2011-12-02 MED ORDER — PRENATAL MULTIVITAMIN CH
1.0000 | ORAL_TABLET | Freq: Every day | ORAL | Status: DC
  Administered 2011-12-03 – 2011-12-04 (×2): 1 via ORAL
  Filled 2011-12-02 (×2): qty 1

## 2011-12-02 MED ORDER — SODIUM CHLORIDE 0.9 % IJ SOLN: 3.0000 mL | INTRAMUSCULAR | Status: DC | PRN

## 2011-12-02 MED ORDER — SCOPOLAMINE 1 MG/3DAYS TD PT72
MEDICATED_PATCH | TRANSDERMAL | Status: AC
  Administered 2011-12-02: 1.5 mg via TRANSDERMAL
  Filled 2011-12-02: qty 1

## 2011-12-02 MED ORDER — KETOROLAC TROMETHAMINE 60 MG/2ML IM SOLN
60.0000 mg | Freq: Once | INTRAMUSCULAR | Status: AC | PRN
  Administered 2011-12-02: 60 mg via INTRAMUSCULAR

## 2011-12-02 MED ORDER — SODIUM CHLORIDE 0.9 % IV SOLN: 250.0000 mL | INTRAVENOUS | Status: DC

## 2011-12-02 MED ORDER — CEFAZOLIN SODIUM 1-5 GM-% IV SOLN
1.0000 g | Freq: Three times a day (TID) | INTRAVENOUS | Status: AC
  Administered 2011-12-02 – 2011-12-03 (×2): 1 g via INTRAVENOUS
  Filled 2011-12-02 (×2): qty 50

## 2011-12-02 MED ORDER — MEPERIDINE HCL 25 MG/ML IJ SOLN: 6.2500 mg | INTRAMUSCULAR | Status: DC | PRN

## 2011-12-02 MED ORDER — METHYLERGONOVINE MALEATE 0.2 MG/ML IJ SOLN: 0.2000 mg | INTRAMUSCULAR | Status: DC | PRN

## 2011-12-02 MED ORDER — METHYLERGONOVINE MALEATE 0.2 MG PO TABS: 0.2000 mg | ORAL_TABLET | ORAL | Status: DC | PRN

## 2011-12-02 MED ORDER — DIPHENHYDRAMINE HCL 50 MG/ML IJ SOLN: 12.5000 mg | INTRAMUSCULAR | Status: DC | PRN

## 2011-12-02 MED ORDER — DIPHENHYDRAMINE HCL 50 MG/ML IJ SOLN: 25.0000 mg | INTRAMUSCULAR | Status: DC | PRN

## 2011-12-02 MED ORDER — IBUPROFEN 600 MG PO TABS: 600.0000 mg | ORAL_TABLET | Freq: Four times a day (QID) | ORAL | Status: DC

## 2011-12-02 MED ORDER — SODIUM CHLORIDE 0.9 % IJ SOLN: 3.0000 mL | Freq: Two times a day (BID) | INTRAMUSCULAR | Status: DC

## 2011-12-02 MED ORDER — EPHEDRINE SULFATE 50 MG/ML IJ SOLN
INTRAMUSCULAR | Status: DC | PRN
  Administered 2011-12-02 (×4): 5 mg via INTRAVENOUS

## 2011-12-02 MED ORDER — SENNOSIDES-DOCUSATE SODIUM 8.6-50 MG PO TABS
2.0000 | ORAL_TABLET | Freq: Every day | ORAL | Status: DC
  Administered 2011-12-02 – 2011-12-03 (×2): 2 via ORAL

## 2011-12-02 MED ORDER — MORPHINE SULFATE 0.5 MG/ML IJ SOLN
INTRAMUSCULAR | Status: AC
  Filled 2011-12-02: qty 10

## 2011-12-02 MED ORDER — IBUPROFEN 600 MG PO TABS
600.0000 mg | ORAL_TABLET | Freq: Four times a day (QID) | ORAL | Status: DC
  Administered 2011-12-02 – 2011-12-04 (×7): 600 mg via ORAL
  Filled 2011-12-02 (×7): qty 1

## 2011-12-02 MED ORDER — SIMETHICONE 80 MG PO CHEW: 80.0000 mg | CHEWABLE_TABLET | ORAL | Status: DC | PRN

## 2011-12-02 MED ORDER — MENTHOL 3 MG MT LOZG: 1.0000 | LOZENGE | OROMUCOSAL | Status: DC | PRN

## 2011-12-02 MED ORDER — WITCH HAZEL-GLYCERIN EX PADS: 1.0000 "application " | MEDICATED_PAD | CUTANEOUS | Status: DC | PRN

## 2011-12-02 SURGICAL SUPPLY — 46 items
BARRIER ADHS 3X4 INTERCEED (GAUZE/BANDAGES/DRESSINGS) ×2 IMPLANT
BENZOIN TINCTURE PRP APPL 2/3 (GAUZE/BANDAGES/DRESSINGS) IMPLANT
CHLORAPREP W/TINT 26ML (MISCELLANEOUS) ×2 IMPLANT
CLOTH BEACON ORANGE TIMEOUT ST (SAFETY) ×2 IMPLANT
CONTAINER PREFILL 10% NBF 15ML (MISCELLANEOUS) IMPLANT
DRESSING TELFA 8X3 (GAUZE/BANDAGES/DRESSINGS) ×2 IMPLANT
DRSG COVADERM 4X10 (GAUZE/BANDAGES/DRESSINGS) ×2 IMPLANT
ELECT REM PT RETURN 9FT ADLT (ELECTROSURGICAL) ×2
ELECTRODE REM PT RTRN 9FT ADLT (ELECTROSURGICAL) ×1 IMPLANT
EXTRACTOR VACUUM KIWI (MISCELLANEOUS) IMPLANT
EXTRACTOR VACUUM M CUP 4 TUBE (SUCTIONS) ×2 IMPLANT
GAUZE SPONGE 4X4 12PLY STRL LF (GAUZE/BANDAGES/DRESSINGS) ×4 IMPLANT
GLOVE BIO SURGEON STRL SZ 6.5 (GLOVE) ×2 IMPLANT
GLOVE BIOGEL PI IND STRL 6.5 (GLOVE) ×1 IMPLANT
GLOVE BIOGEL PI IND STRL 7.0 (GLOVE) ×2 IMPLANT
GLOVE BIOGEL PI INDICATOR 6.5 (GLOVE) ×1
GLOVE BIOGEL PI INDICATOR 7.0 (GLOVE) ×2
GLOVE ECLIPSE 6.0 STRL STRAW (GLOVE) ×2 IMPLANT
GOWN PREVENTION PLUS LG XLONG (DISPOSABLE) ×6 IMPLANT
KIT ABG SYR 3ML LUER SLIP (SYRINGE) IMPLANT
NEEDLE HYPO 25X1 1.5 SAFETY (NEEDLE) ×2 IMPLANT
NEEDLE HYPO 25X5/8 SAFETYGLIDE (NEEDLE) IMPLANT
NS IRRIG 1000ML POUR BTL (IV SOLUTION) ×4 IMPLANT
PACK C SECTION WH (CUSTOM PROCEDURE TRAY) ×2 IMPLANT
PAD ABD 7.5X8 STRL (GAUZE/BANDAGES/DRESSINGS) ×2 IMPLANT
PAD OB MATERNITY 4.3X12.25 (PERSONAL CARE ITEMS) ×2 IMPLANT
RTRCTR C-SECT PINK 25CM LRG (MISCELLANEOUS) IMPLANT
SLEEVE SCD COMPRESS KNEE MED (MISCELLANEOUS) IMPLANT
SPONGE GAUZE 4X4 12PLY (GAUZE/BANDAGES/DRESSINGS) ×2 IMPLANT
STAPLER VISISTAT 35W (STAPLE) ×2 IMPLANT
STRIP CLOSURE SKIN 1/2X4 (GAUZE/BANDAGES/DRESSINGS) IMPLANT
SUT CHROMIC GUT AB #0 18 (SUTURE) IMPLANT
SUT MNCRL 0 VIOLET CTX 36 (SUTURE) ×3 IMPLANT
SUT MON AB 4-0 PS1 27 (SUTURE) IMPLANT
SUT MONOCRYL 0 CTX 36 (SUTURE) ×3
SUT PLAIN 2 0 (SUTURE)
SUT PLAIN ABS 2-0 CT1 27XMFL (SUTURE) IMPLANT
SUT VIC AB 0 CT1 27 (SUTURE) ×2
SUT VIC AB 0 CT1 27XBRD ANBCTR (SUTURE) ×2 IMPLANT
SUT VIC AB 2-0 CT1 27 (SUTURE) ×1
SUT VIC AB 2-0 CT1 TAPERPNT 27 (SUTURE) ×1 IMPLANT
SYR CONTROL 10ML LL (SYRINGE) ×2 IMPLANT
TAPE CLOTH SURG 4X10 WHT LF (GAUZE/BANDAGES/DRESSINGS) ×2 IMPLANT
TOWEL OR 17X24 6PK STRL BLUE (TOWEL DISPOSABLE) ×4 IMPLANT
TRAY FOLEY CATH 14FR (SET/KITS/TRAYS/PACK) IMPLANT
WATER STERILE IRR 1000ML POUR (IV SOLUTION) ×2 IMPLANT

## 2011-12-02 NOTE — Anesthesia Procedure Notes (Signed)
Spinal  Patient location during procedure: OR Start time: 12/02/2011 7:30 AM Staffing Anesthesiologist: Brayton Caves R Performed by: anesthesiologist  Preanesthetic Checklist Completed: patient identified, site marked, surgical consent, pre-op evaluation, timeout performed, IV checked, risks and benefits discussed and monitors and equipment checked Spinal Block Patient position: sitting Prep: DuraPrep Patient monitoring: heart rate, cardiac monitor, continuous pulse ox and blood pressure Approach: midline Location: L3-4 Injection technique: single-shot Needle Needle type: Sprotte  Needle gauge: 24 G Needle length: 9 cm Assessment Sensory level: T4 Additional Notes Patient identified.  Risk benefits discussed including failed block, incomplete pain control, headache, nerve damage, paralysis, blood pressure changes, nausea, vomiting, reactions to medication both toxic or allergic, and postpartum back pain.  Patient expressed understanding and wished to proceed.  All questions were answered.  Sterile technique used throughout procedure.  CSF was clear.  No parasthesia or other complications.  Please see nursing notes for vital signs.

## 2011-12-02 NOTE — Brief Op Note (Signed)
12/02/2011  8:39 AM  PATIENT:  Gabrielle Gilbert  36 y.o. female  PRE-OPERATIVE DIAGNOSIS:  Previous myomectomy, TERM GESTATION  POST-OPERATIVE DIAGNOSIS:  Previous myomectomy, TERM GESTATION, SUBSEROSAL   PROCEDURE:  Procedure(s) (LRB):PRIMARY CESAREAN SECTION (N/A) KERR HYSTEROTOMY  SURGEON:  Surgeon(s) and Role:    * Md Smola Cathie Beams, MD - Primary  PHYSICIAN ASSISTANT:   ASSISTANTS: none  FINDINGS: LIVE FEMALE FLOATING VTX, NL TUBES AND OVARIES ANESTHESIA:   spinal  EBL:  Total I/O In: 2400 [I.V.:2400] Out: 1400 [Urine:300; Blood:1100]  BLOOD ADMINISTERED:none  DRAINS: none   LOCAL MEDICATIONS USED:  MARCAINE     SPECIMEN:  Source of Specimen:  placenta  DISPOSITION OF SPECIMEN:  N/A  COUNTS:  YES  TOURNIQUET:  * No tourniquets in log *  DICTATION: .Other Dictation: Dictation Number [id number]  PLAN OF CARE: Admit to inpatient   PATIENT DISPOSITION:  PACU - hemodynamically stable.   Delay start of Pharmacological VTE agent (>24hrs) due to surgical blood loss or risk of bleeding: no

## 2011-12-02 NOTE — Addendum Note (Signed)
Addendum  created 12/02/11 1706 by Shanon Payor, CRNA   Modules edited:Notes Section

## 2011-12-02 NOTE — Anesthesia Postprocedure Evaluation (Signed)
Anesthesia Post Note  Patient: Gabrielle Gilbert  Procedure(s) Performed: Procedure(s) (LRB): CESAREAN SECTION (N/A)  Anesthesia type: Spinal  Patient location: PACU  Post pain: Pain level controlled  Post assessment: Post-op Vital signs reviewed  Last Vitals:  Filed Vitals:   12/02/11 0845  BP: 130/98  Pulse:   Temp: 36.3 C  Resp: 16    Post vital signs: Reviewed  Level of consciousness: awake  Complications: No apparent anesthesia complications

## 2011-12-02 NOTE — Anesthesia Preprocedure Evaluation (Signed)
Anesthesia Evaluation  Patient identified by MRN, date of birth, ID band Patient awake    Reviewed: Allergy & Precautions, H&P , Patient's Chart, lab work & pertinent test results  Airway Mallampati: II TM Distance: >3 FB Neck ROM: full    Dental No notable dental hx.    Pulmonary  breath sounds clear to auscultation  Pulmonary exam normal       Cardiovascular Exercise Tolerance: Good Rhythm:regular Rate:Normal     Neuro/Psych    GI/Hepatic   Endo/Other    Renal/GU      Musculoskeletal   Abdominal   Peds  Hematology   Anesthesia Other Findings   Reproductive/Obstetrics                           Anesthesia Physical Anesthesia Plan  ASA: III  Anesthesia Plan: Spinal   Post-op Pain Management:    Induction:   Airway Management Planned:   Additional Equipment:   Intra-op Plan:   Post-operative Plan:   Informed Consent: I have reviewed the patients History and Physical, chart, labs and discussed the procedure including the risks, benefits and alternatives for the proposed anesthesia with the patient or authorized representative who has indicated his/her understanding and acceptance.   Dental Advisory Given  Plan Discussed with: CRNA  Anesthesia Plan Comments: (Lab work confirmed with CRNA in room. Platelets okay. Discussed spinal anesthetic, and patient consents to the procedure:  included risk of possible headache,backache, failed block, allergic reaction, and nerve injury. This patient was asked if she had any questions or concerns before the procedure started. )        Anesthesia Quick Evaluation  

## 2011-12-02 NOTE — Transfer of Care (Signed)
Immediate Anesthesia Transfer of Care Note  Patient: Gabrielle Gilbert  Procedure(s) Performed: Procedure(s) (LRB): CESAREAN SECTION (N/A)  Patient Location: PACU  Anesthesia Type: Spinal  Level of Consciousness: awake, alert  and oriented  Airway & Oxygen Therapy: Patient Spontanous Breathing  Post-op Assessment: Report given to PACU RN  Post vital signs: Reviewed and stable  Complications: No apparent anesthesia complications

## 2011-12-02 NOTE — Op Note (Signed)
Gabrielle Gilbert, Gabrielle Gilbert               ACCOUNT NO.:  [id number]  MEDICAL RECORD NO.:  [id number]  LOCATION:  9127                          FACILITY:  [facility]  PHYSICIAN:  Maxie Better, M.D.DATE OF BIRTH:  1975/11/22  DATE OF PROCEDURE:  12/02/2011 DATE OF DISCHARGE:                              OPERATIVE REPORT   PREOPERATIVE DIAGNOSES:  Previous myomectomy, term gestation.  PROCEDURES:   primary cesarean section, Kerr hysterotomy.  POSTOPERATIVE DIAGNOSES:  Previous myomectomy, subserosal fibroid, term gestation.  ANESTHESIA:  Spinal.  SURGEON:  Maxie Better, M.D.  ASSISTANT:  None.  PROCEDURE:  Under adequate spinal anesthesia, the patient was placed in the supine position with a left lateral tilt.  She was sterilely prepped and draped in the usual fashion.  An indwelling Foley catheter was sterilely placed.  0.25% Marcaine was injected along the planned Pfannenstiel skin incision site.  Pfannenstiel skin incision was then made, carried down to the rectus fascia.  Rectus fascia was opened transversely.  Rectus fascia then bluntly and sharply dissected off the rectus muscle in superior and inferior fashion.  The rectus muscle was split in midline.  The parietal peritoneum was entered bluntly and extended.  An undeveloped lower uterine segment was noted.  Some adhesions in the anterior lower uterine segment were noted.  The vesicouterine peritoneum was attempted to be opened transversely.  The bladder was still adherent.  Blunt dissection of the Bladder peritoneun off of the lower uterine segment was then done, and curvilinear low transverse uterine incision was then made and extended bluntly. Artificial rupture of membranes occurred.  The large venous sinus lakes was noted at the time of making the incision.  Subsequent attempt of the delivery of the vertex was unsuccessful resulting the use of vacuum for traction.  Subsequent delivery of a live female was  accomplished. Baby was bulb suctioned in the abdomen.  Cord was clamped and cut.  The baby was transferred to the awaiting pediatrician, who assigned Apgars of 8 and 9 at one and five minutes.  The placenta was manually removed, not sent to pathology.  Uterine cavity was cleaned of debris.  Uterine incision had no extension, was closed in 2 layers, the first layer was 0 Monocryl in running locked stitch, second layer was imbricating with 0 Monocryl suture.  Figure-of-eight suture was placed on the right for bleeder.  Small bleeding on the lower uterine segment was cauterized. Subsequently 3-0 Vicryl suture was placed x2 for good hemostasis in that area.  Normal tubes and ovaries were noted bilaterally.  There was a 3.5 cm anterior subserosal fibroid  noted.  Abdomen was copiously irrigated and suctioned the debris. The Interceed was placed over the uterine incision in an inverted T fashion. The parietal peritoneum was closed with 2-0 Vicryl suture.  The rectus fascia was closed with 0 Vicryl x2.  The subcutaneous area was irrigated, small bleeders cauterized.  Interrupted 2-0 plain sutures placed, and the skin approximated using Ethicon staples.  SPECIMEN:  Placenta, not sent to pathology.  ESTIMATED BLOOD LOSS:  1100 mL.  INTRAOPERATIVE FLUID:  2400 mL.  URINE:  200 mL clear yellow urine.  Sponge, instrument counts x2 was correct.  COMPLICATIONS:  None.  The patient tolerated the procedure well, was transferred to recovery room in stable condition. Baby weighed 8 lb 5 oz     Maxie Better, M.D.     [place]/[facility]  D:  12/02/2011  T:  12/02/2011  Job:  [id number]

## 2011-12-02 NOTE — Addendum Note (Signed)
Addendum  created 12/02/11 0920 by Velna Hatchet, MD   Modules edited:Anesthesia Blocks and Procedures, Anesthesia Medication Administration, Inpatient Notes

## 2011-12-02 NOTE — Addendum Note (Signed)
Addendum  created 12/02/11 0920 by Jaala Bohle R Estera Ozier, MD   Modules edited:Anesthesia Blocks and Procedures, Anesthesia Medication Administration, Inpatient Notes    

## 2011-12-02 NOTE — Progress Notes (Signed)
OOB to bathroom, steady on feet,

## 2011-12-02 NOTE — Anesthesia Postprocedure Evaluation (Signed)
  Anesthesia Post-op Note  Patient: Gabrielle Gilbert  Procedure(s) Performed: Procedure(s) (LRB): CESAREAN SECTION (N/A)  Patient Location: Mother/Baby  Anesthesia Type: Spinal  Level of Consciousness: awake, alert  and oriented  Airway and Oxygen Therapy: Patient Spontanous Breathing  Post-op Pain: none  Post-op Assessment: Post-op Vital signs reviewed and Patient's Cardiovascular Status Stable  Post-op Vital Signs: Reviewed and stable  Complications: No apparent anesthesia complications

## 2011-12-03 LAB — CBC
HCT: 28.4 % — ABNORMAL LOW (ref 36.0–46.0)
Hemoglobin: 9.9 g/dL — ABNORMAL LOW (ref 12.0–15.0)
MCHC: 34.9 g/dL (ref 30.0–36.0)
RDW: 13.6 % (ref 11.5–15.5)
WBC: 11.3 10*3/uL — ABNORMAL HIGH (ref 4.0–10.5)

## 2011-12-03 MED ORDER — RHO D IMMUNE GLOBULIN 1500 UNIT/2ML IJ SOLN
300.0000 ug | Freq: Once | INTRAMUSCULAR | Status: AC
  Administered 2011-12-03: 300 ug via INTRAMUSCULAR
  Filled 2011-12-03: qty 2

## 2011-12-03 NOTE — Progress Notes (Addendum)
POSTOPERATIVE DAY # 1 S/P cesarean section   S:         Reports feeling ok / + cramping              Tolerating po intake / no nausea / no vomiting / no flatus / no BM             Bleeding is light             Pain controlled with percocet and motrin             Up ad lib / ambulatory  Newborn breast feeding  / Circumcision planned   O:  A & O x 3 NAD / talkative             VS: Blood pressure 124/65, pulse 70, temperature 98 F (36.7 C), temperature source Oral, resp. rate 18, height 5' 0.5" (1.537 m), weight 88.451 kg (195 lb), last menstrual period 03/04/2011, SpO2 95.00%, unknown if currently breastfeeding.  LABS: WBC/Hgb/Hct/Plts:  11.3/9.9/28.4/129 (06/29 0500)   I&O:   + 1500~  Lungs: Clear and unlabored  Heart: regular rate and rhythm / no mumurs  Abdomen: soft, non-tender, non-distended, hypoactive bowel sounds             Fundus: firm, non-tender, Ueven             Dressing intact              Lochia: light  Extremities: no edema, no calf pain or tenderness  A:        POD # 1 S/P cesarean section            Mild acute blood loss anemia  P:        Routine postoperative care - advance activity             DC IV / shower and remove dressing today              Iron supplement     Marlinda Mike CNM, MSN 12/03/2011, 9:26 AM

## 2011-12-04 ENCOUNTER — Encounter (HOSPITAL_COMMUNITY): Payer: Self-pay | Admitting: Obstetrics and Gynecology

## 2011-12-04 LAB — RH IG WORKUP (INCLUDES ABO/RH)
ABO/RH(D): A NEG
Fetal Screen: NEGATIVE
Gestational Age(Wks): 39
Unit division: 0

## 2011-12-04 MED ORDER — POLYSACCHARIDE IRON COMPLEX 150 MG PO CAPS: 150.0000 mg | ORAL_CAPSULE | Freq: Two times a day (BID) | ORAL | Status: DC

## 2011-12-04 MED ORDER — IBUPROFEN 600 MG PO TABS: 600.0000 mg | ORAL_TABLET | Freq: Four times a day (QID) | ORAL | Status: AC

## 2011-12-04 MED ORDER — OXYCODONE-ACETAMINOPHEN 5-325 MG PO TABS: 1.0000 | ORAL_TABLET | ORAL | Status: AC | PRN

## 2011-12-04 NOTE — Discharge Summary (Signed)
POSTOPERATIVE DISCHARGE SUMMARY:  Patient ID: Gabrielle Gilbert MRN: [id number] DOB/AGE: 1976-03-01 36 y.o.  Admit date: 12/02/2011 Discharge date:  12/04/2011  Admission Diagnoses:  previous myomectomy  Discharge Diagnoses:   Term Pregnancy-delivered  POD 2 s/p cesarean section  Mild acute blood loss anemia  Prenatal history: [id number]   EDC : 12/09/2011, by Last Menstrual Period  Prenatal care at [facility]  Primary provider : Cousins Prenatal course complicated by hx myomectomy - cesarean delivery planned  Prenatal Labs: ABO, Rh: A (12/06 0000) negative Antibody: negative Rubella: Immune (12/06 0000)  RPR: NON REACTIVE (06/25 0902)  HBsAg: Negative (12/06 0000)  HIV: Non-reactive (12/06 0000)  1 hr Glucola : NL   Medical / Surgical History :  Past medical history:  Past Medical History  Diagnosis Date  . Fibroid   . Endometriosis   . Abnormal Pap smear     Past surgical history:  Past Surgical History  Procedure Date  . Myomectomy   . Dilation and curettage of uterus   . Cholecystectomy   . Laparoscopy   . Laparoscopic ovarian cystectomy   . Leep   . Wisdom tooth extraction     Family History:  Family History  Problem Relation Age of Onset  . Anesthesia problems Neg Hx     Social History:  reports that she has never smoked. She has never used smokeless tobacco. She reports that she does not drink alcohol or use illicit drugs.   Allergies: Review of patient's allergies indicates no known allergies.    Current Medications at time of admission:  Prior to Admission medications   Medication Sig Start Date End Date Taking? Authorizing Provider  Prenatal Vit-Fe Fumarate-FA (PRENATAL MULTIVITAMIN) TABS Take 1 tablet by mouth every morning.   Yes Historical Provider, MD  zolpidem (AMBIEN) 10 MG tablet Take 5 mg by mouth at bedtime as needed. For sleep   Yes Historical Provider, MD  acetaminophen (TYLENOL) 500 MG tablet Take 1,000 mg by  mouth every 6 (six) hours as needed. For headache    Historical Provider, MD    Procedures: Cesarean section delivery of Female newborn by Dr Cherly Hensen  See operative report for further details  Postoperative / postpartum course: uneventful - discharge on POD 2 in stable condition  Physical Exam:   VSS: Blood pressure 134/79, pulse 76, temperature 97.6 F (36.4 C), temperature source Oral, resp. rate 18, height 5' 0.5" (1.537 m), weight 88.451 kg (195 lb), last menstrual period 03/04/2011, SpO2 95.00%, unknown if currently breastfeeding.  LABS:   preop hgb 13 / postop hgb 9.9 General:pleasant / NAD or pain Heart:RR Lungs: unlabored / clear Abdomen: soft / non-distended / active bowel sounds Extremities: no edema  Incision:  approximated with staples / no erythema / no ecchymosis / no drainage Staples: intact for removal on POD 4 at [facility]  Discharge Instructions:  Discharged Condition: stable Activity: pelvic rest and postoperative restrictions x 2 weeks Diet: routine Medications: see below Medication List  As of 12/04/2011 11:39 AM   TAKE these medications         acetaminophen 500 MG tablet   Commonly known as: TYLENOL   Take 1,000 mg by mouth every 6 (six) hours as needed. For headache      ibuprofen 600 MG tablet   Commonly known as: ADVIL,MOTRIN   Take 1 tablet (600 mg total) by mouth every 6 (six) hours.      iron polysaccharides 150 MG capsule   Commonly known  as: NIFEREX   Take 1 capsule (150 mg total) by mouth 2 (two) times daily.      oxyCODONE-acetaminophen 5-325 MG per tablet   Commonly known as: PERCOCET   Take 1 tablet by mouth every 4 (four) hours as needed (moderate - severe pain).      prenatal multivitamin Tabs   Take 1 tablet by mouth every morning.      zolpidem 10 MG tablet   Commonly known as: AMBIEN   Take 5 mg by mouth at bedtime as needed. For sleep           Condition: stable Postpartum Instructions: refer to practice specific  booklet Discharge to: home Disposition: 01-Home or Self Care Follow up :  Follow-up Information    Follow up with COUSINS,SHERONETTE A, MD. Schedule an appointment as soon as possible for a visit in 6 weeks. (staple removal Tuesday - call for apt time)    Contact information:   [address] [place] [address] [phone]           Signed: Marlinda Mike [facility] 12/04/2011, 11:39 AM

## 2011-12-04 NOTE — Progress Notes (Signed)
POSTOPERATIVE DAY # 2 S/P cesarean section   S:         Reports feeling well - ready for discharge today             Tolerating po intake / no nausea / no  vomiting / + flatus / no BM             Bleeding is light             Pain controlled withprescription NSAID's including motrin             Up ad lib / ambulatory  Newborn breast-feeding  / Circumcision done   O:  A & O x 3 NAD             VS: Blood pressure 134/79, pulse 76, temperature 97.6 F (36.4 C), temperature source Oral, resp. rate 18, height 5' 0.5" (1.537 m), weight 88.451 kg (195 lb), last menstrual period 03/04/2011, SpO2 95.00%, unknown if currently breastfeeding.  Lungs: Clear and unlabored  Heart: regular rate and rhythm   Abdomen: soft, non-tender, non-distended, active BS             Fundus: firm, non-tender, U-1             Dressing OFF             Incision:  approximated with staples / no erythema / no ecchymosis / no drainage but damp under panus  Lochia: light  Extremities: trace edema, no calf pain or tenderness  A:        POD # 2 S/P cesarean section            Stable for early discharge  P:        Routine postoperative care              Discharge to home             [facility] booklet - instructions reviewed / staple removal at [facility] Tuesday     Marlinda Mike CNM, MSN 12/04/2011, 11:34 AM

## 2012-01-22 NOTE — Progress Notes (Signed)
MFM Note  Gabrielle Gilbert is a 36 year old [id number] Caucasian female at [redacted] weeks gestation who presents for consultation and management of an increasing anti-D antibody titer. We reviewed her pregnancies, anti-D titers and her Rhophylac records. Then, we had a discussion regarding the pathophysiology of alloimmunization. Ms. Heiner was present and began calling to find out his blood type. He was told he had Rh negative blood. They understood that is was crucial to the health of their fetus that the actual lab result be reviewed and confirmed and that paternity could not be in question. If Mr. Menees is truly Rh -, then the fetus is not at risk. They were offered cffDNA for fetal RH status and declined.  Thank you for the kind referral.  (Face-to-face consultation with patient: 20 min)

## 2012-05-01 DIAGNOSIS — Z0000 Encounter for general adult medical examination without abnormal findings: Secondary | ICD-10-CM | POA: Insufficient documentation

## 2012-12-24 ENCOUNTER — Emergency Department (INDEPENDENT_AMBULATORY_CARE_PROVIDER_SITE_OTHER): Payer: Managed Care, Other (non HMO)

## 2012-12-24 ENCOUNTER — Encounter (HOSPITAL_COMMUNITY): Payer: Self-pay | Admitting: *Deleted

## 2012-12-24 ENCOUNTER — Emergency Department (INDEPENDENT_AMBULATORY_CARE_PROVIDER_SITE_OTHER)
Admission: EM | Admit: 2012-12-24 | Discharge: 2012-12-24 | Disposition: A | Discharge status: Home / Self Care | Payer: Managed Care, Other (non HMO) | Source: Home / Self Care

## 2012-12-24 DIAGNOSIS — R1013 Epigastric pain: Secondary | ICD-10-CM

## 2012-12-24 DIAGNOSIS — K5900 Constipation, unspecified: Secondary | ICD-10-CM

## 2012-12-24 DIAGNOSIS — E860 Dehydration: Secondary | ICD-10-CM

## 2012-12-24 DIAGNOSIS — K5289 Other specified noninfective gastroenteritis and colitis: Secondary | ICD-10-CM

## 2012-12-24 DIAGNOSIS — K529 Noninfective gastroenteritis and colitis, unspecified: Secondary | ICD-10-CM

## 2012-12-24 LAB — POCT I-STAT, CHEM 8
BUN: 11 mg/dL (ref 6–23)
Chloride: 103 mEq/L (ref 96–112)
Creatinine, Ser: 1.1 mg/dL (ref 0.50–1.10)
Glucose, Bld: 105 mg/dL — ABNORMAL HIGH (ref 70–99)
Potassium: 3.5 mEq/L (ref 3.5–5.1)

## 2012-12-24 MED ORDER — SODIUM CHLORIDE 0.9 % IV SOLN
Freq: Once | INTRAVENOUS | Status: AC
  Administered 2012-12-24: 18:00:00 via INTRAVENOUS

## 2012-12-24 MED ORDER — ONDANSETRON HCL 4 MG/2ML IJ SOLN
4.0000 mg | Freq: Once | INTRAMUSCULAR | Status: AC
  Administered 2012-12-24: 4 mg via INTRAVENOUS

## 2012-12-24 MED ORDER — ONDANSETRON HCL 4 MG PO TABS: 4.0000 mg | ORAL_TABLET | Freq: Four times a day (QID) | ORAL | Status: DC

## 2012-12-24 MED ORDER — ONDANSETRON 4 MG PO TBDP: 4.0000 mg | ORAL_TABLET | Freq: Once | ORAL | Status: DC

## 2012-12-24 MED ORDER — ONDANSETRON HCL 4 MG/2ML IJ SOLN
INTRAMUSCULAR | Status: AC
  Filled 2012-12-24: qty 2

## 2012-12-24 NOTE — ED Provider Notes (Signed)
Medical screening examination/treatment/procedure(s) were performed by resident physician or non-physician practitioner and as supervising physician I was immediately available for consultation/collaboration.   Barkley Bruns MD.   Linna Hoff, MD 12/24/12 2025

## 2012-12-24 NOTE — ED Provider Notes (Signed)
History    CSN: [id number] Arrival date & time 12/24/12  1504  First MD Initiated Contact with Patient 12/24/12 1625     Chief Complaint  Patient presents with  . Abdominal Pain   (Consider location/radiation/quality/duration/timing/severity/associated sxs/prior Treatment) HPI Comments: 37 year old female presents with sharp and dull, pain across the upper abdomen. The predominance of pain is in the epigastrium. This began around midnight last night. She said nausea and vomited once. He later this afternoon around 2:00 she has had some episodes of diarrhea. . She points to her epigastrium states that it is sharp and dull. She denies rash, urinary symptoms, vaginal discharge, sore throat, earache, cough, trouble breathing, chest pain, fever or sweats. She has had no recent travel out of the state nor has she been around anyone who has been traveling. No recent camping trips. No one else in the household is sick. She states the abdominal pain is worse when he tries to drink something like water. He will other calls her to have pain in increased nausea sometimes diarrhea. No known history of tick exposure. She does not smoke and she does not consume alcohol.  Past Medical History  Diagnosis Date  . Fibroid   . Endometriosis   . Abnormal Pap smear    Past Surgical History  Procedure Laterality Date  . Myomectomy    . Dilation and curettage of uterus    . Cholecystectomy    . Laparoscopy    . Laparoscopic ovarian cystectomy    . Leep    . Wisdom tooth extraction    . Cesarean section  12/02/2011    Procedure: CESAREAN SECTION;  Surgeon: Serita Kyle, MD;  Location: [facility];  Service: Gynecology;  Laterality: N/A;   Family History  Problem Relation Age of Onset  . Anesthesia problems Neg Hx    History  Substance Use Topics  . Smoking status: Never Smoker   . Smokeless tobacco: Never Used  . Alcohol Use: No   OB History   Grav Para Term Preterm Abortions TAB SAB Ect Mult  Living   5 2 2  0 3 0 3 0 0 2     Review of Systems  Constitutional: Positive for activity change and appetite change. Negative for fever.  HENT: Negative.   Eyes: Negative for pain, discharge, itching and visual disturbance.  Respiratory: Negative.   Cardiovascular: Negative for chest pain and leg swelling.  Gastrointestinal: Positive for nausea, vomiting, abdominal pain and diarrhea. Negative for blood in stool and abdominal distention.  Genitourinary: Negative.   Musculoskeletal: Positive for myalgias.  Skin: Negative for rash.  Neurological: Negative.     Allergies  Review of patient's allergies indicates no known allergies.  Home Medications   Current Outpatient Rx  Name  Route  Sig  Dispense  Refill  . acetaminophen (TYLENOL) 500 MG tablet   Oral   Take 1,000 mg by mouth every 6 (six) hours as needed. For headache         . EXPIRED: iron polysaccharides (NIFEREX) 150 MG capsule   Oral   Take 1 capsule (150 mg total) by mouth 2 (two) times daily.   30 capsule   0   . ondansetron (ZOFRAN) 4 MG tablet   Oral   Take 1 tablet (4 mg total) by mouth every 6 (six) hours.   12 tablet   0   . Prenatal Vit-Fe Fumarate-FA (PRENATAL MULTIVITAMIN) TABS   Oral   Take 1 tablet by mouth every morning.         Marland Kitchen  zolpidem (AMBIEN) 10 MG tablet   Oral   Take 5 mg by mouth at bedtime as needed. For sleep          BP 116/99  Pulse 78  Temp(Src) 98.4 F (36.9 C) (Oral)  Resp 18  SpO2 99%  LMP 12/03/2012 Physical Exam  Nursing note and vitals reviewed. Constitutional: She is oriented to person, place, and time. She appears well-developed and well-nourished. No distress.  Appears mildly ill.  HENT:  Right Ear: External ear normal.  Left Ear: External ear normal.  Nose: Nose normal.  Mouth/Throat: Oropharynx is clear and moist. No oropharyngeal exudate.  Eyes: Conjunctivae and EOM are normal. Pupils are equal, round, and reactive to light.  Neck: Normal range of  motion. Neck supple.  Cardiovascular: Normal rate, regular rhythm and normal heart sounds.   Pulmonary/Chest: Effort normal and breath sounds normal. No respiratory distress. She has no wheezes. She has no rales.  Abdominal: Soft. She exhibits no distension and no mass. There is tenderness. There is no rebound.  Primary tenderness is in the epigastrium. There is also tenderness in the left and right lower quadrants. No tenderness in the right upper quadrant. No Murphy's sign. Bowel sounds are present, normal pitched but hypoactive.  Musculoskeletal: She exhibits no edema and no tenderness.  Lymphadenopathy:    She has no cervical adenopathy.  Neurological: She is alert and oriented to person, place, and time. She exhibits normal muscle tone.  Skin: Skin is warm and dry.  Psychiatric: She has a normal mood and affect.    ED Course  Procedures (including critical care time) Labs Reviewed  POCT I-STAT, CHEM 8 - Abnormal; Notable for the following:    Glucose, Bld 105 (*)    Hemoglobin 16.0 (*)    HCT 47.0 (*)    All other components within normal limits   Dg Abd 1 View  12/24/2012   *RADIOLOGY REPORT*  Clinical Data: Generalized abdominal pain.  ABDOMEN - 1 VIEW  Comparison: None.  Findings: Single frontal view of the abdomen demonstrates an IUD. Bowel gas pattern appears within normal limits.  No dilated loops of large or small bowel are identified on the frontal view.  No gross plain film evidence of free air.  IMPRESSION: No acute abnormality.   Original Report Authenticated By: Andreas Newport, M.D.   1. Gastroenteritis   2. Dehydration   3. Constipation   4. Epigastric pain     MDM  1900h the patient has received 1 L IV normal saline and 4 mg of Zofran IV. She states she is feeling much better her abdominal pain has ameliorated greatly and she is ready to go home. She is instructed to drink clear fluids for the next 24 hours no dairy products or heavy meals or fast foods. May advance  diet slowly and eat bland foods primarily. She is advised that if her pain returns, gets worse, begins vomiting cannot hold fluids down, develops fever or other associated symptoms she is to go to the emergency department promptly.  Hayden Rasmussen, NP 12/24/12 1900

## 2012-12-24 NOTE — Discharge Instructions (Signed)
Abdominal Pain Abdominal pain can be caused by many things. Your caregiver decides the seriousness of your pain by an examination and possibly blood tests and X-rays. Many cases can be observed and treated at home. Most abdominal pain is not caused by a disease and will probably improve without treatment. However, in many cases, more time must pass before a clear cause of the pain can be found. Before that point, it may not be known if you need more testing, or if hospitalization or surgery is needed. HOME CARE INSTRUCTIONS   Do not take laxatives unless directed by your caregiver.  Take pain medicine only as directed by your caregiver.  Only take over-the-counter or prescription medicines for pain, discomfort, or fever as directed by your caregiver.  Try a clear liquid diet (broth, tea, or water) for as long as directed by your caregiver. Slowly move to a bland diet as tolerated. SEEK IMMEDIATE MEDICAL CARE IF:   The pain does not go away.  You have a fever.  You keep throwing up (vomiting).  The pain is felt only in portions of the abdomen. Pain in the right side could possibly be appendicitis. In an adult, pain in the left lower portion of the abdomen could be colitis or diverticulitis.  You pass bloody or black tarry stools. MAKE SURE YOU:   Understand these instructions.  Will watch your condition.  Will get help right away if you are not doing well or get worse. Document Released: 03/02/2005 Document Revised: 08/15/2011 Document Reviewed: 01/09/2008 [facility] Patient Information 2014 [place], [place].  Constipation, Adult Constipation is when a person has fewer than 3 bowel movements a week; has difficulty having a bowel movement; or has stools that are dry, hard, or larger than normal. As people grow older, constipation is more common. If you try to fix constipation with medicines that make you have a bowel movement (laxatives), the problem may get worse. Long-term laxative use  may cause the muscles of the colon to become weak. A low-fiber diet, not taking in enough fluids, and taking certain medicines may make constipation worse. CAUSES   Certain medicines, such as antidepressants, pain medicine, iron supplements, antacids, and water pills.   Certain diseases, such as diabetes, irritable bowel syndrome (IBS), thyroid disease, or depression.   Not drinking enough water.   Not eating enough fiber-rich foods.   Stress or travel.  Lack of physical activity or exercise.  Not going to the restroom when there is the urge to have a bowel movement.  Ignoring the urge to have a bowel movement.  Using laxatives too much. SYMPTOMS   Having fewer than 3 bowel movements a week.   Straining to have a bowel movement.   Having hard, dry, or larger than normal stools.   Feeling full or bloated.   Pain in the lower abdomen.  Not feeling relief after having a bowel movement. DIAGNOSIS  Your caregiver will take a medical history and perform a physical exam. Further testing may be done for severe constipation. Some tests may include:   A barium enema X-ray to examine your rectum, colon, and sometimes, your small intestine.  A sigmoidoscopy to examine your lower colon.  A colonoscopy to examine your entire colon. TREATMENT  Treatment will depend on the severity of your constipation and what is causing it. Some dietary treatments include drinking more fluids and eating more fiber-rich foods. Lifestyle treatments may include regular exercise. If these diet and lifestyle recommendations do not help, your caregiver  may recommend taking over-the-counter laxative medicines to help you have bowel movements. Prescription medicines may be prescribed if over-the-counter medicines do not work.  HOME CARE INSTRUCTIONS   Increase dietary fiber in your diet, such as fruits, vegetables, whole grains, and beans. Limit high-fat and processed sugars in your diet, such as  [place] fries, hamburgers, cookies, candies, and soda.   A fiber supplement may be added to your diet if you cannot get enough fiber from foods.   Drink enough fluids to keep your urine clear or pale yellow.   Exercise regularly or as directed by your caregiver.   Go to the restroom when you have the urge to go. Do not hold it.  Only take medicines as directed by your caregiver. Do not take other medicines for constipation without talking to your caregiver first. SEEK IMMEDIATE MEDICAL CARE IF:   You have bright red blood in your stool.   Your constipation lasts for more than 4 days or gets worse.   You have abdominal or rectal pain.   You have thin, pencil-like stools.  You have unexplained weight loss. MAKE SURE YOU:   Understand these instructions.  Will watch your condition.  Will get help right away if you are not doing well or get worse. Document Released: 02/19/2004 Document Revised: 08/15/2011 Document Reviewed: 04/26/2011 [facility] Patient Information 2014 [place], [place].  Dehydration, Adult Dehydration means your body does not have as much fluid as it needs. Your kidneys, brain, and heart will not work properly without the right amount of fluids and salt.  HOME CARE  Ask your doctor how to replace body fluid losses (rehydrate).  Drink enough fluids to keep your pee (urine) clear or pale yellow.  Drink small amounts of fluids often if you feel sick to your stomach (nauseous) or throw up (vomit).  Eat like you normally do.  Avoid:  Foods or drinks high in sugar.  Bubbly (carbonated) drinks.  Juice.  Very hot or cold fluids.  Drinks with caffeine.  Fatty, greasy foods.  Alcohol.  Tobacco.  Eating too much.  [place]elatin desserts.  Wash your hands to avoid spreading germs (bacteria, viruses).  Only take medicine as told by your doctor.  Keep all doctor visits as told. GET HELP RIGHT AWAY IF:   You cannot drink something without throwing  up.  You get worse even with treatment.  Your vomit has blood in it or looks greenish.  Your poop (stool) has blood in it or looks black and tarry.  You have not peed in 6 to 8 hours.  You pee a small amount of very dark pee.  You have a fever.  You pass out (faint).  You have belly (abdominal) pain that gets worse or stays in one spot (localizes).  You have a rash, stiff neck, or bad headache.  You get easily annoyed, sleepy, or are hard to wake up.  You feel weak, dizzy, or very thirsty. MAKE SURE YOU:   Understand these instructions.  Will watch your condition.  Will get help right away if you are not doing well or get worse. Document Released: 03/19/2009 Document Revised: 08/15/2011 Document Reviewed: 01/10/2011 [facility] Patient Information 2014 [place], [place].  Rehydration, Adult Rehydration is the replacement of body fluids lost during dehydration. Dehydration is an extreme loss of body fluids to the point of body function impairment. There are many ways extreme fluid loss can occur, including vomiting, diarrhea, or excess sweating. Recovering from dehydration requires replacing lost fluids, continuing to  eat to maintain strength, and avoiding foods and beverages that may contribute to further fluid loss or may increase nausea. HOW TO REHYDRATE In most cases, rehydration involves the replacement of not only fluids but also carbohydrates and basic body salts. Rehydration with an oral rehydration solution is one way to replace essential nutrients lost through dehydration. An oral rehydration solution can be purchased at pharmacies, retail stores, and online. Premixed packets of powder that you combine with water to make a solution are also sold. You can prepare an oral rehydration solution at home by mixing the following ingredients together:      tsp table salt.   tsp baking soda.   tsp salt substitute containing potassium chloride.  1 tablespoons sugar.  1 L (34  oz) of water. Be sure to use exact measurements. Including too much sugar can make diarrhea worse. Drink  1 cup (120 240 mL) of oral rehydration solution each time you have diarrhea or vomit. If drinking this amount makes your vomiting worse, try drinking smaller amounts more often. For example, drink 1 3 tsp every 5 10 minutes.  A general rule for staying hydrated is to drink 1 2 L of fluid per day. Talk to your caregiver about the specific amount you should be drinking each day. Drink enough fluids to keep your urine clear or pale yellow. EATING WHEN DEHYDRATED Even if you have had severe sweating or you are having diarrhea, do not stop eating. Many healthy items in a normal diet are okay to continue eating while recovering from dehydration. The following tips can help you to lessen nausea when you eat:  Ask someone else to prepare your food. Cooking smells may worsen nausea.  Eat in a well-ventilated room away from cooking smells.  Sit up when you eat. Avoid lying down until 1 2 hours after eating.  Eat small amounts when you eat.  Eat foods that are easy to digest. These include soft, well-cooked, or mashed foods. FOODS AND BEVERAGES TO AVOID Avoid eating or drinking the following foods and beverages that may increase nausea or further loss of fluid:   Fruit juices with a high sugar content, such as concentrated juices.  Alcohol.  Beverages containing caffeine.  Carbonated drinks. They may cause a lot of gas.  Foods that may cause a lot of gas, such as cabbage, broccoli, and beans.  Fatty, greasy, and fried foods.  Spicy, very salty, and very sweet foods or drinks.  Foods or drinks that are very hot or very cold. Consume food or drinks at or near room temperature.  Foods that need a lot of chewing, such as raw vegetables.  Foods that are sticky or hard to swallow, such as peanut butter. Document Released: 08/15/2011 Document Revised: 02/15/2012 Document Reviewed:  08/15/2011 [facility] Patient Information 2014 [place], [place].  Viral Gastroenteritis Viral gastroenteritis is also known as stomach flu. This condition affects the stomach and intestinal tract. It can cause sudden diarrhea and vomiting. The illness typically lasts 3 to 8 days. Most people develop an immune response that eventually gets rid of the virus. While this natural response develops, the virus can make you quite ill. CAUSES  Many different viruses can cause gastroenteritis, such as rotavirus or noroviruses. You can catch one of these viruses by consuming contaminated food or water. You may also catch a virus by sharing utensils or other personal items with an infected person or by touching a contaminated surface. SYMPTOMS  The most common symptoms are diarrhea and  vomiting. These problems can cause a severe loss of body fluids (dehydration) and a body salt (electrolyte) imbalance. Other symptoms may include:  Fever.  Headache.  Fatigue.  Abdominal pain. DIAGNOSIS  Your caregiver can usually diagnose viral gastroenteritis based on your symptoms and a physical exam. A stool sample may also be taken to test for the presence of viruses or other infections. TREATMENT  This illness typically goes away on its own. Treatments are aimed at rehydration. The most serious cases of viral gastroenteritis involve vomiting so severely that you are not able to keep fluids down. In these cases, fluids must be given through an intravenous line (IV). HOME CARE INSTRUCTIONS   Drink enough fluids to keep your urine clear or pale yellow. Drink small amounts of fluids frequently and increase the amounts as tolerated.  Ask your caregiver for specific rehydration instructions.  Avoid:  Foods high in sugar.  Alcohol.  Carbonated drinks.  Tobacco.  Juice.  Caffeine drinks.  Extremely hot or cold fluids.  Fatty, greasy foods.  Too much intake of anything at one time.  Dairy products until 24  to 48 hours after diarrhea stops.  You may consume probiotics. Probiotics are active cultures of beneficial bacteria. They may lessen the amount and number of diarrheal stools in adults. Probiotics can be found in yogurt with active cultures and in supplements.  Wash your hands well to avoid spreading the virus.  Only take over-the-counter or prescription medicines for pain, discomfort, or fever as directed by your caregiver. Do not give aspirin to children. Antidiarrheal medicines are not recommended.  Ask your caregiver if you should continue to take your regular prescribed and over-the-counter medicines.  Keep all follow-up appointments as directed by your caregiver. SEEK IMMEDIATE MEDICAL CARE IF:   You are unable to keep fluids down.  You do not urinate at least once every 6 to 8 hours.  You develop shortness of breath.  You notice blood in your stool or vomit. This may look like coffee grounds.  You have abdominal pain that increases or is concentrated in one small area (localized).  You have persistent vomiting or diarrhea.  You have a fever.  The patient is a child younger than 3 months, and he or she has a fever.  The patient is a child older than 3 months, and he or she has a fever and persistent symptoms.  The patient is a child older than 3 months, and he or she has a fever and symptoms suddenly get worse.  The patient is a baby, and he or she has no tears when crying. MAKE SURE YOU:   Understand these instructions.  Will watch your condition.  Will get help right away if you are not doing well or get worse. Document Released: 05/23/2005 Document Revised: 08/15/2011 Document Reviewed: 03/09/2011 [facility] Patient Information 2014 [place], [place].

## 2012-12-24 NOTE — ED Notes (Signed)
Pt  Reports  Symptoms  Of  Abdominal   Pain       described  As  Cramping  And  In  Waves    -    She  Also  Reports  Nausea      Diarrhea  And  Vomited   X  1   Today         She  Reports  Chills     Body  Aches         And  Sweats             Symptoms   X 1     Day           She  Is  Sitting  Upright  On  Exam  Table  Speaking in  Complete        sentances

## 2014-04-07 ENCOUNTER — Encounter (HOSPITAL_COMMUNITY): Payer: Self-pay | Admitting: *Deleted

## 2017-01-22 ENCOUNTER — Emergency Department (HOSPITAL_COMMUNITY)
Admission: EM | Admit: 2017-01-22 | Discharge: 2017-01-22 | Disposition: A | Discharge status: Home / Self Care | Payer: 59 | Attending: Emergency Medicine | Admitting: Emergency Medicine

## 2017-01-22 ENCOUNTER — Encounter (HOSPITAL_COMMUNITY): Payer: Self-pay

## 2017-01-22 ENCOUNTER — Emergency Department (HOSPITAL_COMMUNITY): Payer: 59

## 2017-01-22 DIAGNOSIS — R109 Unspecified abdominal pain: Secondary | ICD-10-CM | POA: Diagnosis not present

## 2017-01-22 DIAGNOSIS — K5792 Diverticulitis of intestine, part unspecified, without perforation or abscess without bleeding: Secondary | ICD-10-CM

## 2017-01-22 DIAGNOSIS — K5732 Diverticulitis of large intestine without perforation or abscess without bleeding: Secondary | ICD-10-CM | POA: Insufficient documentation

## 2017-01-22 LAB — URINALYSIS, ROUTINE W REFLEX MICROSCOPIC
Bilirubin Urine: NEGATIVE
Glucose, UA: NEGATIVE mg/dL
Hgb urine dipstick: NEGATIVE
KETONES UR: NEGATIVE mg/dL
LEUKOCYTES UA: NEGATIVE
NITRITE: NEGATIVE
PH: 5 (ref 5.0–8.0)
PROTEIN: NEGATIVE mg/dL
Specific Gravity, Urine: 1.021 (ref 1.005–1.030)

## 2017-01-22 LAB — POC URINE PREG, ED: PREG TEST UR: NEGATIVE

## 2017-01-22 MED ORDER — HYDROCODONE-ACETAMINOPHEN 5-325 MG PO TABS: 1.0000 | ORAL_TABLET | Freq: Four times a day (QID) | ORAL | 0 refills | Status: DC | PRN

## 2017-01-22 MED ORDER — ONDANSETRON 4 MG PO TBDP
4.0000 mg | ORAL_TABLET | Freq: Once | ORAL | Status: AC
  Administered 2017-01-22: 4 mg via ORAL
  Filled 2017-01-22: qty 1

## 2017-01-22 MED ORDER — METRONIDAZOLE 500 MG PO TABS: 500.0000 mg | ORAL_TABLET | Freq: Two times a day (BID) | ORAL | 0 refills | Status: DC

## 2017-01-22 MED ORDER — CIPROFLOXACIN HCL 500 MG PO TABS: 500.0000 mg | ORAL_TABLET | Freq: Two times a day (BID) | ORAL | 0 refills | Status: DC

## 2017-01-22 MED ORDER — CIPROFLOXACIN HCL 500 MG PO TABS
500.0000 mg | ORAL_TABLET | Freq: Once | ORAL | Status: AC
  Administered 2017-01-22: 500 mg via ORAL
  Filled 2017-01-22 (×2): qty 1

## 2017-01-22 MED ORDER — METRONIDAZOLE 500 MG PO TABS
500.0000 mg | ORAL_TABLET | Freq: Once | ORAL | Status: AC
  Administered 2017-01-22: 500 mg via ORAL
  Filled 2017-01-22 (×2): qty 1

## 2017-01-22 MED ORDER — OXYCODONE-ACETAMINOPHEN 5-325 MG PO TABS
1.0000 | ORAL_TABLET | Freq: Once | ORAL | Status: AC
  Administered 2017-01-22: 1 via ORAL
  Filled 2017-01-22: qty 1

## 2017-01-22 NOTE — ED Provider Notes (Signed)
[facility] DEPT Provider Note   CSN: [id number] Arrival date & time: 01/22/17  0219     History   Chief Complaint Chief Complaint  Patient presents with  . Flank Pain    HPI Gabrielle Gilbert is a 41 y.o. female.  This a normally healthy 41 year old moderately obese female who presents with acute onset of left flank pain.  She states the pain is constant, but has waves of intensity that make her nauseated.  Denies any trauma, constipation, diarrhea, dysuria, cough, fever      Past Medical History:  Diagnosis Date  . Abnormal Pap smear   . Endometriosis   . Fibroid     Patient Active Problem List   Diagnosis Date Noted  . Postpartum care following cesarean delivery (6/28) 12/02/2011  . URI 07/13/2009    Past Surgical History:  Procedure Laterality Date  . CESAREAN SECTION  12/02/2011   Procedure: CESAREAN SECTION;  Surgeon: Marvene Staff, MD;  Location: [facility];  Service: Gynecology;  Laterality: N/A;  . CHOLECYSTECTOMY    . DILATION AND CURETTAGE OF UTERUS    . LAPAROSCOPIC OVARIAN CYSTECTOMY    . LAPAROSCOPY    . LEEP    . MYOMECTOMY    . WISDOM TOOTH EXTRACTION      OB History    Gravida Para Term Preterm AB Living   5 2 2  0 3 2   SAB TAB Ectopic Multiple Live Births   3 0 0 0 1       Home Medications    Prior to Admission medications   Medication Sig Start Date End Date Taking? Authorizing Provider  acetaminophen (TYLENOL) 500 MG tablet Take 1,000 mg by mouth every 6 (six) hours as needed. For headache    [provider]  ciprofloxacin (CIPRO) 500 MG tablet Take 1 tablet (500 mg total) by mouth 2 (two) times daily. 01/22/17   Junius Creamer, NP  HYDROcodone-acetaminophen (NORCO/VICODIN) 5-325 MG tablet Take 1 tablet by mouth every 6 (six) hours as needed for severe pain. 01/22/17   Junius Creamer, NP  iron polysaccharides (NIFEREX) 150 MG capsule Take 1 capsule (150 mg total) by mouth 2 (two) times daily. 12/04/11 12/03/12  Artelia Laroche,  CNM  metroNIDAZOLE (FLAGYL) 500 MG tablet Take 1 tablet (500 mg total) by mouth 2 (two) times daily. 01/22/17   Junius Creamer, NP  ondansetron (ZOFRAN) 4 MG tablet Take 1 tablet (4 mg total) by mouth every 6 (six) hours. 12/24/12   Janne Napoleon, NP  Prenatal Vit-Fe Fumarate-FA (PRENATAL MULTIVITAMIN) TABS Take 1 tablet by mouth every morning.    [provider]  zolpidem (AMBIEN) 10 MG tablet Take 5 mg by mouth at bedtime as needed. For sleep    [provider]    Family History Family History  Problem Relation Age of Onset  . Anesthesia problems Neg Hx     Social History Social History  Substance Use Topics  . Smoking status: Never Smoker  . Smokeless tobacco: Never Used  . Alcohol use No     Allergies   Patient has no known allergies.   Review of Systems Review of Systems  Constitutional: Negative for fever.  Gastrointestinal: Positive for abdominal pain.  Genitourinary: Positive for flank pain. Negative for dysuria, hematuria, vaginal discharge and vaginal pain.  All other systems reviewed and are negative.    Physical Exam Updated Vital Signs BP 125/65 (BP Location: Right Arm)   Pulse 73   Temp 99.1 F (37.3  C) (Oral)   Resp 18   Ht 5' (1.524 m)   Wt 77.1 kg (170 lb)   SpO2 99%   BMI 33.20 kg/m   Physical Exam  Constitutional: She appears well-developed and well-nourished.  HENT:  Head: Normocephalic.  Eyes: Pupils are equal, round, and reactive to light.  Neck: Normal range of motion.  Cardiovascular: Normal rate.   Pulmonary/Chest: Effort normal.  Abdominal: Soft. She exhibits no distension. There is tenderness.    Musculoskeletal: Normal range of motion.  Neurological: She is alert.  Skin: Skin is warm.  Psychiatric: She has a normal mood and affect.  Nursing note and vitals reviewed.    ED Treatments / Results  Labs (all labs ordered are listed, but only abnormal results are displayed) Labs Reviewed  URINALYSIS, ROUTINE W  REFLEX MICROSCOPIC  POC URINE PREG, ED    EKG  EKG Interpretation None       Radiology Ct Renal Stone Study  Result Date: 01/22/2017 CLINICAL DATA:  LEFT flank pain beginning last night. EXAM: CT ABDOMEN AND PELVIS WITHOUT CONTRAST TECHNIQUE: Multidetector CT imaging of the abdomen and pelvis was performed following the standard protocol without IV contrast. COMPARISON:  None. FINDINGS: LOWER CHEST: Lung bases are clear. The visualized heart size is normal. No pericardial effusion. HEPATOBILIARY: Status post cholecystectomy. Normal noncontrast CT liver. PANCREAS: Normal. SPLEEN: Normal. ADRENALS/URINARY TRACT: Kidneys are orthotopic, demonstrating normal size and morphology. No nephrolithiasis, hydronephrosis; limited assessment for renal masses on this nonenhanced examination. The unopacified ureters are normal in course and caliber. Urinary bladder is partially distended and unremarkable. Normal adrenal glands. STOMACH/BOWEL: The stomach, small and large bowel are normal in course and caliber without inflammatory changes, sensitivity decreased by lack of enteric contrast. Moderate colonic diverticulosis with superimposed short segment of wall thickening and inflammatory changes associated with dense diverticula within colonic splenic flexure. Pericolonic fat stranding and focal thickened peritoneal reflection. Normal appendix. VASCULAR/LYMPHATIC: Aortoiliac vessels are normal in course and caliber. No lymphadenopathy by CT size criteria. REPRODUCTIVE: IUD in central uterus. OTHER: Trace free fluid in the pelvis is likely physiologic. MUSCULOSKELETAL: Non-acute. IMPRESSION: 1. Acute diverticulitis involving colonic splenic flexure. 2. No nephrolithiasis, no obstructive uropathy. Electronically Signed   By: Elon Alas M.D.   On: 01/22/2017 04:16    Procedures Procedures (including critical care time)  Medications Ordered in ED Medications  ciprofloxacin (CIPRO) tablet 500 mg (not  administered)  metroNIDAZOLE (FLAGYL) tablet 500 mg (not administered)  oxyCODONE-acetaminophen (PERCOCET/ROXICET) 5-325 MG per tablet 1 tablet (1 tablet Oral Given 01/22/17 0332)  ondansetron (ZOFRAN-ODT) disintegrating tablet 4 mg (4 mg Oral Given 01/22/17 0332)     Initial Impression / Assessment and Plan / ED Course  I have reviewed the triage vital signs and the nursing notes.  Pertinent labs & imaging results that were available during my care of the patient were reviewed by me and considered in my medical decision making (see chart for details).      Urine is normal.  Patient's physical examination is consistent with kidney stone.  We'll obtain renal stone study  Final Clinical Impressions(s) / ED Diagnoses   Final diagnoses:  Diverticulitis    New Prescriptions New Prescriptions   CIPROFLOXACIN (CIPRO) 500 MG TABLET    Take 1 tablet (500 mg total) by mouth 2 (two) times daily.   HYDROCODONE-ACETAMINOPHEN (NORCO/VICODIN) 5-325 MG TABLET    Take 1 tablet by mouth every 6 (six) hours as needed for severe pain.   METRONIDAZOLE (FLAGYL) 500 MG TABLET  Take 1 tablet (500 mg total) by mouth 2 (two) times daily.     Junius Creamer, NP 01/22/17 [place], [place], DO 01/22/17 [phone]

## 2017-01-22 NOTE — ED Triage Notes (Signed)
Pt presents to the ED from home c/o sharp L flank pain and nausea that started at 2300 last night. Denies any urinary symptoms, fevers/chills.

## 2017-01-22 NOTE — Discharge Instructions (Signed)
Today you were evaluated for left-sided abdominal flank pain.  CT scan shows that you have diverticulitis.  This is being treated with antibiotics, Cipro and Flagyl.  Please take these antibiotics as directed until all tablets have been completed.  You've also been given a prescription for Vicodin or hydrocodone, which is a narcotic pain medicine, take 1 tablet every 6 hours as needed for severe pain.  Otherwise, you can take Tylenol or ibuprofen.  Make sure you follow-up with your new provider for establishment of care

## 2017-01-22 NOTE — ED Notes (Signed)
ED Provider at bedside. 

## 2017-01-24 ENCOUNTER — Ambulatory Visit (INDEPENDENT_AMBULATORY_CARE_PROVIDER_SITE_OTHER): Payer: 59 | Admitting: Family Medicine

## 2017-01-24 ENCOUNTER — Encounter: Payer: Self-pay | Admitting: Family Medicine

## 2017-01-24 VITALS — BP 116/70 | HR 60 | Resp 12 | Ht 60.0 in | Wt 192.6 lb

## 2017-01-24 DIAGNOSIS — K5792 Diverticulitis of intestine, part unspecified, without perforation or abscess without bleeding: Secondary | ICD-10-CM | POA: Diagnosis not present

## 2017-01-24 DIAGNOSIS — R55 Syncope and collapse: Secondary | ICD-10-CM | POA: Diagnosis not present

## 2017-01-24 LAB — CBC WITH DIFFERENTIAL/PLATELET
Basophils Absolute: 0 10*3/uL (ref 0.0–0.1)
Basophils Relative: 0.4 % (ref 0.0–3.0)
EOS PCT: 1.3 % (ref 0.0–5.0)
Eosinophils Absolute: 0.1 10*3/uL (ref 0.0–0.7)
HEMATOCRIT: 45.1 % (ref 36.0–46.0)
HEMOGLOBIN: 15.2 g/dL — AB (ref 12.0–15.0)
LYMPHS PCT: 21.6 % (ref 12.0–46.0)
Lymphs Abs: 1.9 10*3/uL (ref 0.7–4.0)
MCHC: 33.7 g/dL (ref 30.0–36.0)
MCV: 88.5 fl (ref 78.0–100.0)
MONOS PCT: 6.9 % (ref 3.0–12.0)
Monocytes Absolute: 0.6 10*3/uL (ref 0.1–1.0)
Neutro Abs: 6.1 10*3/uL (ref 1.4–7.7)
Neutrophils Relative %: 69.8 % (ref 43.0–77.0)
Platelets: 235 10*3/uL (ref 150.0–400.0)
RBC: 5.1 Mil/uL (ref 3.87–5.11)
RDW: 12.7 % (ref 11.5–15.5)
WBC: 8.7 10*3/uL (ref 4.0–10.5)

## 2017-01-24 LAB — BASIC METABOLIC PANEL
BUN: 10 mg/dL (ref 6–23)
CO2: 31 mEq/L (ref 19–32)
Calcium: 9.1 mg/dL (ref 8.4–10.5)
Chloride: 105 mEq/L (ref 96–112)
Creatinine, Ser: 0.96 mg/dL (ref 0.40–1.20)
GFR: 68.83 mL/min (ref >60.00)
GLUCOSE: 88 mg/dL (ref 70–99)
POTASSIUM: 4 meq/L (ref 3.5–5.1)
Sodium: 140 mEq/L (ref 135–145)

## 2017-01-24 LAB — TSH: TSH: 2.81 u[IU]/mL (ref 0.35–4.50)

## 2017-01-24 NOTE — Patient Instructions (Addendum)
A few things to remember from today's visit:   Syncope, unspecified syncope type - Plan: EKG 34-VQQV, Basic metabolic panel, TSH, CBC with Differential/Platelet, Ambulatory referral to Cardiology  Acute diverticulitis of intestine  Bland diet until complete recovery from diverticulitis. Adequate hydration.  Complete antibiotics.  Syncope Syncope is when you temporarily lose consciousness. Syncope may also be called fainting or passing out. It is caused by a sudden decrease in blood flow to the brain. Even though most causes of syncope are not dangerous, syncope can be a sign of a serious medical problem. Signs that you may be about to faint include:  Feeling dizzy or light-headed.  Feeling nauseous.  Seeing all white or all black in your field of vision.  Having cold, clammy skin.  If you fainted, get medical help right away.Call your local emergency services (911 in the [place] Do not drive yourself to the hospital. Follow these instructions at home: Pay attention to any changes in your symptoms. Take these actions to help with your condition:  Have someone stay with you until you feel stable.  Do not drive, use machinery, or play sports until your health care provider says it is okay.  Keep all follow-up visits as told by your health care provider. This is important.  If you start to feel like you might faint, lie down right away and raise (elevate) your feet above the level of your heart. Breathe deeply and steadily. Wait until all of the symptoms have passed.  Drink enough fluid to keep your urine clear or pale yellow.  If you are taking blood pressure or heart medicine, get up slowly and take several minutes to sit and then stand. This can reduce dizziness.  Take over-the-counter and prescription medicines only as told by your health care provider.  Get help right away if:  You have a severe headache.  You have unusual pain in your chest, abdomen, or back.  You are  bleeding from your mouth or rectum, or you have black or tarry stool.  You have a very fast or irregular heartbeat (palpitations).  You have pain with breathing.  You faint once or repeatedly.  You have a seizure.  You are confused.  You have trouble walking.  You have severe weakness.  You have vision problems. These symptoms may represent a serious problem that is an emergency. Do not wait to see if your symptoms will go away. Get medical help right away. Call your local emergency services (911 in the [place] Do not drive yourself to the hospital. This information is not intended to replace advice given to you by your health care provider. Make sure you discuss any questions you have with your health care provider. Document Released: 05/23/2005 Document Revised: 10/29/2015 Document Reviewed: 02/04/2015 Elsevier Interactive Patient Education  2017 [employer].   Please be sure medication list is accurate. If a new problem present, please set up appointment sooner than planned today.

## 2017-01-24 NOTE — Progress Notes (Signed)
HPI:   Gabrielle Gilbert is a 41 y.o. female, who is here today to establish care.  Former PCP: Dr Laneta Simmers Last preventive routine visit: She follows with gyn regularly. Reporting recent lab work through work, lipid panel and FG, all in normal range.  She exercises regularly ad follows a healthy diet.   Chronic medical problems: Otherwise healthy except for intermittent episodes of syncope.   She was recently in the ER and Dx with acute diverticulitis, 01/22/17. She is on her 3rd day of Flagyl and Cipro. Tolerating abx well, no significant side effects.  Today abdominal pain "is not bad", LLQ. Last night she ate steak and had some bloating sensation.  She has not has fever,chills, nausea, or vomiting. Appetite is better. Last bowel movement today. Having soft stools, denies blood in stool. She is taking Advil during the day, Hydrocodone-Acetaminophen at bedtime.  She wonders if she needs to take Probiotics and of she needs to change her diet.  Concerns today:   Syncope: She has had intermittent episodes of syncope since 1998, last one in 2002. On 01/14/17 early morning (6:30 am) while she was still in bed reading her e-mail she "passed out." According to pt, her husband was with her and he thought she was asleep, she was snoring loud. She is not sure for how ling she was unconscious , heard her husband "yelling" her name, she could not move for a few seconds. + Diaphoretic.  She felt like she needed to urinate right after event, denies urine or bowel incontinence. According to pt, husband did not see any limp movement or seizure like activity. Right before episode she remembers feeling mild mid chest tightness and heartburn.  2 days after LOC episode she felt tightness in mid chest while she was exercising, cross fit. Alleviated when she lowed down. No associated symptoms.  No changes in her diet and no unusual stress.  She is concerned because her sister was having  similar symptoms, "spells", and recently Dx with valve disease.   She denies Hx of anxiety or depression. No known Hx of sleep apnea.  Review of Systems  Constitutional: Negative for activity change, appetite change, fatigue, fever and unexpected weight change.  HENT: Negative for mouth sores, nosebleeds and trouble swallowing.   Eyes: Negative for redness and visual disturbance.  Respiratory: Positive for chest tightness. Negative for cough, shortness of breath and wheezing.   Cardiovascular: Negative for palpitations and leg swelling.  Gastrointestinal: Positive for abdominal pain. Negative for blood in stool, nausea and vomiting.  Endocrine: Negative for cold intolerance, heat intolerance, polydipsia, polyphagia and polyuria.  Genitourinary: Negative for decreased urine volume, dysuria and hematuria.  Musculoskeletal: Negative for gait problem and myalgias.  Skin: Negative for rash.  Neurological: Positive for syncope. Negative for seizures, facial asymmetry, weakness, numbness and headaches.  Hematological: Negative for adenopathy. Does not bruise/bleed easily.  Psychiatric/Behavioral: Negative for confusion and sleep disturbance. The patient is not nervous/anxious.       Current Outpatient Prescriptions on File Prior to Visit  Medication Sig Dispense Refill  . acetaminophen (TYLENOL) 500 MG tablet Take 1,000 mg by mouth every 6 (six) hours as needed. For headache    . ciprofloxacin (CIPRO) 500 MG tablet Take 1 tablet (500 mg total) by mouth 2 (two) times daily. 20 tablet 0  . HYDROcodone-acetaminophen (NORCO/VICODIN) 5-325 MG tablet Take 1 tablet by mouth every 6 (six) hours as needed for severe pain. 10 tablet 0  . metroNIDAZOLE (  FLAGYL) 500 MG tablet Take 1 tablet (500 mg total) by mouth 2 (two) times daily. 14 tablet 0   No current facility-administered medications on file prior to visit.      Past Medical History:  Diagnosis Date  . Abnormal Pap smear   . Endometriosis     . Fibroid   . History of fainting spells of unknown cause    Past Surgical History:  Procedure Laterality Date  . CESAREAN SECTION  12/02/2011   Procedure: CESAREAN SECTION;  Surgeon: Marvene Staff, MD;  Location: [facility];  Service: Gynecology;  Laterality: N/A;  . CHOLECYSTECTOMY    . DILATION AND CURETTAGE OF UTERUS    . LAPAROSCOPIC OVARIAN CYSTECTOMY    . LAPAROSCOPY    . LEEP    . MYOMECTOMY    . WISDOM TOOTH EXTRACTION      No Known Allergies  Family History  Problem Relation Age of Onset  . Hyperlipidemia Mother   . Heart disease Mother   . Hypertension Mother   . Hyperlipidemia Father   . Heart disease Father   . Hypertension Father   . Anesthesia problems Neg Hx     Social History   Social History  . Marital status: Married    Spouse name: N/A  . Number of children: N/A  . Years of education: N/A   Social History Main Topics  . Smoking status: Never Smoker  . Smokeless tobacco: Never Used  . Alcohol use No  . Drug use: No  . Sexual activity: Yes   Other Topics Concern  . None   Social History Narrative  . None    Vitals:   01/24/17 0817  BP: 116/70  Pulse: 60  Resp: 12  SpO2: 98%    Body mass index is 37.61 kg/m.   Physical Exam  Nursing note and vitals reviewed. Constitutional: She is oriented to person, place, and time. She appears well-developed. No distress.  HENT:  Head: Normocephalic and atraumatic.  Mouth/Throat: Oropharynx is clear and moist and mucous membranes are normal.  Eyes: Pupils are equal, round, and reactive to light. Conjunctivae and EOM are normal.  Neck: No tracheal deviation present. No thyroid mass and no thyromegaly present.  Cardiovascular: Normal rate and regular rhythm.   No murmur heard. Pulses:      Dorsalis pedis pulses are 2+ on the right side, and 2+ on the left side.  Respiratory: Effort normal and breath sounds normal. No respiratory distress.  GI: Soft. Bowel sounds are normal. She exhibits  no mass. There is no hepatomegaly. There is no tenderness.  Musculoskeletal: She exhibits no edema or tenderness.  Lymphadenopathy:    She has no cervical adenopathy.  Neurological: She is alert and oriented to person, place, and time. She has normal strength. No cranial nerve deficit. Coordination and gait normal.  Skin: Skin is warm. No rash noted. No erythema.  Psychiatric: She has a normal mood and affect.  Well groomed, good eye contact.     ASSESSMENT AND PLAN:   Gabrielle Gilbert was seen today for establish care.  Diagnoses and all orders for this visit: Lab Results  Component Value Date   WBC 8.7 01/24/2017   HGB 15.2 (H) 01/24/2017   HCT 45.1 01/24/2017   MCV 88.5 01/24/2017   PLT 235.0 01/24/2017   Lab Results  Component Value Date   CREATININE 0.96 01/24/2017   BUN 10 01/24/2017   NA 140 01/24/2017   K 4.0 01/24/2017   CL 105  01/24/2017   CO2 31 01/24/2017   Lab Results  Component Value Date   TSH 2.81 01/24/2017    Syncope, unspecified syncope type  We discussed possible etiologies: ? Arrhythmia/cardiac, vasovagal,psychiatric,seizure disorder among some. EKG done today: Sinus arrhythmia, normal axis and intervals.Otherwsie normal. No other EKG for comparison. Instructed about warning signs. Cardiac evaluation will be arranged. Further recommendations will be given according to lab results.   -     EKG 12-Lead -     Basic metabolic panel -     TSH -     CBC with Differential/Platelet -     Ambulatory referral to Cardiology  Acute diverticulitis of intestine  Improving. Abx side effects discussed. She could take Probiotic daily. Soft/bland diet and advance as tolerated.  Complete abx treatment. Instructed about warning signs.  -     Basic metabolic panel -     CBC with Differential/Platelet       Betty G. Martinique, MD  [facility]. [place] office.

## 2017-02-14 NOTE — Progress Notes (Deleted)
    Referring-Betty G Martinique, MD Reason for referral-Syncope  HPI: 41 year old female for evaluation of syncope at request of Betty G Martinique M.D. Laboratories August 2018 showed normal hemoglobin, normal potassium and normal TSH.  Current Outpatient Prescriptions  Medication Sig Dispense Refill  . acetaminophen (TYLENOL) 500 MG tablet Take 1,000 mg by mouth every 6 (six) hours as needed. For headache    . ciprofloxacin (CIPRO) 500 MG tablet Take 1 tablet (500 mg total) by mouth 2 (two) times daily. 20 tablet 0  . HYDROcodone-acetaminophen (NORCO/VICODIN) 5-325 MG tablet Take 1 tablet by mouth every 6 (six) hours as needed for severe pain. 10 tablet 0  . metroNIDAZOLE (FLAGYL) 500 MG tablet Take 1 tablet (500 mg total) by mouth 2 (two) times daily. 14 tablet 0   No current facility-administered medications for this visit.     No Known Allergies  Past Medical History:  Diagnosis Date  . Abnormal Pap smear   . Endometriosis   . Fibroid   . History of fainting spells of unknown cause     Past Surgical History:  Procedure Laterality Date  . CESAREAN SECTION  12/02/2011   Procedure: CESAREAN SECTION;  Surgeon: Marvene Staff, MD;  Location: [facility];  Service: Gynecology;  Laterality: N/A;  . CHOLECYSTECTOMY    . DILATION AND CURETTAGE OF UTERUS    . LAPAROSCOPIC OVARIAN CYSTECTOMY    . LAPAROSCOPY    . LEEP    . MYOMECTOMY    . WISDOM TOOTH EXTRACTION      Social History   Social History  . Marital status: Married    Spouse name: N/A  . Number of children: N/A  . Years of education: N/A   Occupational History  . Not on file.   Social History Main Topics  . Smoking status: Never Smoker  . Smokeless tobacco: Never Used  . Alcohol use No  . Drug use: No  . Sexual activity: Yes   Other Topics Concern  . Not on file   Social History Narrative  . No narrative on file    Family History  Problem Relation Age of Onset  . Hyperlipidemia Mother   . Heart disease  Mother   . Hypertension Mother   . Hyperlipidemia Father   . Heart disease Father   . Hypertension Father   . Anesthesia problems Neg Hx     ROS: no fevers or chills, productive cough, hemoptysis, dysphasia, odynophagia, melena, hematochezia, dysuria, hematuria, rash, seizure activity, orthopnea, PND, pedal edema, claudication. Remaining systems are negative.  Physical Exam:   There were no vitals taken for this visit.  General:  Well developed/well nourished in NAD Skin warm/dry Patient not depressed No peripheral clubbing Back-normal HEENT-normal/normal eyelids Neck supple/normal carotid upstroke bilaterally; no bruits; no JVD; no thyromegaly chest - CTA/ normal expansion CV - RRR/normal S1 and S2; no murmurs, rubs or gallops;  PMI nondisplaced Abdomen -NT/ND, no HSM, no mass, + bowel sounds, no bruit 2+ femoral pulses, no bruits Ext-no edema, chords, 2+ DP Neuro-grossly nonfocal  ECG -01/24/2017-sinus bradycardia with no ST changes. personally reviewed  A/P  1  Kirk Ruths, MD

## 2017-02-20 ENCOUNTER — Ambulatory Visit: Payer: 59 | Admitting: Cardiology

## 2017-02-28 DIAGNOSIS — R55 Syncope and collapse: Secondary | ICD-10-CM | POA: Insufficient documentation

## 2017-02-28 NOTE — Progress Notes (Signed)
HPI:   Gabrielle Gilbert is a 41 y.o. female, who is here today to follow on recent OV.   She was seen on 01/24/17, when she was c/o intermittent episodes of syncopal episodes since 1998. Last episode 01/14/17 in the morning while she was in bed. She was referred to cardiologists, she canceled appointment, she is planning on rescheduling. She has not had any episode of lightheadedness or syncope since her last visit.  She is still exercising regularly, she usually does wt lifting but she has started with cardio aerobic exercise. She denies any chest pain, palpitations,or dizziness with intense exercise.  She was also Dx with acute diverticulitis on 01/22/17. She was on her 3rd day of abx during last OV. She completed antibiotic treatment, well tolerated. Acute abdominal pain resolved, she still has some mild pain on LUQ when she doesn't have a bowel movement or when she eats red meat. She denies associated nausea, vomiting, blood in the stools, or melena.  She is also following a healthy diet and has decreased red meat intake.  She doesn't have new concerns today.  Review of Systems  Constitutional: Negative for activity change, appetite change, fatigue, fever and unexpected weight change.  HENT: Negative for mouth sores, nosebleeds and trouble swallowing.   Respiratory: Negative for shortness of breath and wheezing.   Cardiovascular: Negative for chest pain, palpitations and leg swelling.  Gastrointestinal: Positive for abdominal pain. Negative for blood in stool, nausea and vomiting.       Negative for changes in bowel habits.  Genitourinary: Negative for decreased urine volume, dysuria and hematuria.  Skin: Negative for pallor and rash.  Neurological: Negative for seizures, syncope, weakness, numbness and headaches.  Psychiatric/Behavioral: Negative for confusion. The patient is not nervous/anxious.       Current Outpatient Prescriptions on File Prior to Visit    Medication Sig Dispense Refill  . acetaminophen (TYLENOL) 500 MG tablet Take 1,000 mg by mouth every 6 (six) hours as needed. For headache     No current facility-administered medications on file prior to visit.      Past Medical History:  Diagnosis Date  . Abnormal Pap smear   . Endometriosis   . Fibroid   . History of fainting spells of unknown cause    No Known Allergies  Social History   Social History  . Marital status: Married    Spouse name: N/A  . Number of children: N/A  . Years of education: N/A   Social History Main Topics  . Smoking status: Never Smoker  . Smokeless tobacco: Never Used  . Alcohol use No  . Drug use: No  . Sexual activity: Yes   Other Topics Concern  . None   Social History Narrative  . None    Vitals:   03/01/17 0733 03/01/17 0752  BP: 118/80   Pulse: 80 60  Resp: 12   SpO2: 97%    Body mass index is 37.79 kg/m.  Wt Readings from Last 3 Encounters:  03/01/17 193 lb 8 oz (87.8 kg)  01/24/17 192 lb 9 oz (87.3 kg)  01/22/17 170 lb (77.1 kg)     Physical Exam  Nursing note and vitals reviewed. Constitutional: She is oriented to person, place, and time. She appears well-developed. She does not appear ill. No distress.  HENT:  Head: Normocephalic and atraumatic.  Mouth/Throat: Oropharynx is clear and moist and mucous membranes are normal.  Eyes: Pupils are equal, round, and reactive to  light. Conjunctivae are normal. No scleral icterus.  Cardiovascular: Normal rate and regular rhythm.   No murmur heard. Pulses:      Dorsalis pedis pulses are 2+ on the right side, and 2+ on the left side.  Respiratory: Effort normal and breath sounds normal. No respiratory distress.  GI: Soft. She exhibits no distension and no mass. There is no hepatomegaly. There is no tenderness.  Musculoskeletal: She exhibits no edema or tenderness.  Lymphadenopathy:    She has no cervical adenopathy.  Neurological: She is alert and oriented to person,  place, and time. She has normal strength.  Skin: Skin is warm. No rash noted. No erythema.  Psychiatric: She has a normal mood and affect.  Well groomed, good eye contact.    ASSESSMENT AND PLAN:   Gabrielle Gilbert was seen today for follow-up.  Diagnoses and all orders for this visit:  Acute diverticulitis of intestine  Resolved. Instructed about warning signs.  Diverticula of colon  Educated about diagnosis. In general adequate hydration and high fiber intake might help to prevent future complications.  LUQ abdominal pain  Examination today negative. Recommend avoiding aggravating factors she has already identified. Daily probiotic for 3-4 weeks recommended given the fact she recently completed abx treatment. Clearly instructed about warning signs.  Syncope, unspecified syncope type  She has not had any episode since her last visit but she has had this problem intermittently for a few years now. She was clearly instructed about warning signs and is strongly recommended rescheduling cardiology appointment. I don't think further workup is needed at this time.  Class 2 obesity without serious comorbidity with body mass index (BMI) of 37.0 to 37.9 in adult, unspecified obesity type  We discussed benefits of wt loss as well as adverse effects of obesity. Consistency with healthy diet and continue regular physical activity: more aerobic exercise and less wt lifting (1-2 times per week).  Need for influenza vaccination -     Flu Vaccine QUAD 36+ mos IM     Ronna Herskowitz G. Martinique, MD  [facility]. [place] office.

## 2017-03-01 ENCOUNTER — Encounter: Payer: Self-pay | Admitting: Family Medicine

## 2017-03-01 ENCOUNTER — Ambulatory Visit (INDEPENDENT_AMBULATORY_CARE_PROVIDER_SITE_OTHER): Payer: 59 | Admitting: Family Medicine

## 2017-03-01 VITALS — BP 118/80 | HR 60 | Resp 12 | Ht 60.0 in | Wt 193.5 lb

## 2017-03-01 DIAGNOSIS — E669 Obesity, unspecified: Secondary | ICD-10-CM

## 2017-03-01 DIAGNOSIS — R55 Syncope and collapse: Secondary | ICD-10-CM

## 2017-03-01 DIAGNOSIS — K5730 Diverticulosis of large intestine without perforation or abscess without bleeding: Secondary | ICD-10-CM | POA: Insufficient documentation

## 2017-03-01 DIAGNOSIS — E66812 Obesity, class 2: Secondary | ICD-10-CM | POA: Insufficient documentation

## 2017-03-01 DIAGNOSIS — Z6837 Body mass index (BMI) 37.0-37.9, adult: Secondary | ICD-10-CM | POA: Diagnosis not present

## 2017-03-01 DIAGNOSIS — K5792 Diverticulitis of intestine, part unspecified, without perforation or abscess without bleeding: Secondary | ICD-10-CM | POA: Diagnosis not present

## 2017-03-01 DIAGNOSIS — Z23 Encounter for immunization: Secondary | ICD-10-CM

## 2017-03-01 DIAGNOSIS — R1012 Left upper quadrant pain: Secondary | ICD-10-CM

## 2017-03-01 NOTE — Patient Instructions (Addendum)
A few things to remember from today's visit:   LUQ abdominal pain  Syncope, unspecified syncope type  Acute diverticulitis of intestine  Acute episode resolved. Re-schedule cardiology appt. Align once daily for 3-4 weeks (Probiotic).     Diverticulosis Diverticulosis is a condition that develops when small pouches (diverticula) form in the wall of the large intestine (colon). The colon is where water is absorbed and stool is formed. The pouches form when the inside layer of the colon pushes through weak spots in the outer layers of the colon. You may have a few pouches or many of them. What are the causes? The cause of this condition is not known. What increases the risk? The following factors may make you more likely to develop this condition:  Being older than age 29. Your risk for this condition increases with age. Diverticulosis is rare among people younger than age 84. By age 23, many people have it.  Eating a low-fiber diet.  Having frequent constipation.  Being overweight.  Not getting enough exercise.  Smoking.  Taking over-the-counter pain medicines, like aspirin and ibuprofen.  Having a family history of diverticulosis.  What are the signs or symptoms? In most people, there are no symptoms of this condition. If you do have symptoms, they may include:  Bloating.  Cramps in the abdomen.  Constipation or diarrhea.  Pain in the lower left side of the abdomen.  How is this diagnosed? This condition is most often diagnosed during an exam for other colon problems. Because diverticulosis usually has no symptoms, it often cannot be diagnosed independently. This condition may be diagnosed by:  Using a flexible scope to examine the colon (colonoscopy).  Taking an X-ray of the colon after dye has been put into the colon (barium enema).  Doing a CT scan.  How is this treated? You may not need treatment for this condition if you have never developed an  infection related to diverticulosis. If you have had an infection before, treatment may include:  Eating a high-fiber diet. This may include eating more fruits, vegetables, and grains.  Taking a fiber supplement.  Taking a live bacteria supplement (probiotic).  Taking medicine to relax your colon.  Taking antibiotic medicines.  Follow these instructions at home:  Drink 6-8 glasses of water or more each day to prevent constipation.  Try not to strain when you have a bowel movement.  If you have had an infection before: ? Eat more fiber as directed by your health care provider or your diet and nutrition specialist (dietitian). ? Take a fiber supplement or probiotic, if your health care provider approves.  Take over-the-counter and prescription medicines only as told by your health care provider.  If you were prescribed an antibiotic, take it as told by your health care provider. Do not stop taking the antibiotic even if you start to feel better.  Keep all follow-up visits as told by your health care provider. This is important. Contact a health care provider if:  You have pain in your abdomen.  You have bloating.  You have cramps.  You have not had a bowel movement in 3 days. Get help right away if:  Your pain gets worse.  Your bloating becomes very bad.  You have a fever or chills, and your symptoms suddenly get worse.  You vomit.  You have bowel movements that are bloody or black.  You have bleeding from your rectum. Summary  Diverticulosis is a condition that develops when small pouches (  diverticula) form in the wall of the large intestine (colon).  You may have a few pouches or many of them.  This condition is most often diagnosed during an exam for other colon problems.  If you have had an infection related to diverticulosis, treatment may include increasing the fiber in your diet, taking supplements, or taking medicines. This information is not intended  to replace advice given to you by your health care provider. Make sure you discuss any questions you have with your health care provider. Document Released: 02/18/2004 Document Revised: 04/11/2016 Document Reviewed: 04/11/2016 Elsevier Interactive Patient Education  2017 [employer].   Please be sure medication list is accurate. If a new problem present, please set up appointment sooner than planned today.

## 2017-03-07 ENCOUNTER — Ambulatory Visit (INDEPENDENT_AMBULATORY_CARE_PROVIDER_SITE_OTHER): Payer: 59 | Admitting: Cardiology

## 2017-03-07 ENCOUNTER — Encounter: Payer: Self-pay | Admitting: Cardiology

## 2017-03-07 ENCOUNTER — Ambulatory Visit: Payer: 59 | Admitting: Cardiology

## 2017-03-07 VITALS — BP 108/62 | HR 64 | Ht 60.0 in | Wt 194.2 lb

## 2017-03-07 DIAGNOSIS — R55 Syncope and collapse: Secondary | ICD-10-CM

## 2017-03-07 NOTE — Patient Instructions (Signed)
Medication Instructions:  None  Labwork: None  Testing/Procedures: Your physician has requested that you have an echocardiogram. Echocardiography is a painless test that uses sound waves to create images of your heart. It provides your doctor with information about the size and shape of your heart and how well your heart's chambers and valves are working. This procedure takes approximately one hour. There are no restrictions for this procedure.  Your physician has requested that you have a stress echocardiogram. For further information please visit [email or web address]. Please follow instruction sheet as given.  Your physician has recommended that you wear a holter monitor. Holter monitors are medical devices that record the heart's electrical activity. Doctors most often use these monitors to diagnose arrhythmias. Arrhythmias are problems with the speed or rhythm of the heartbeat. The monitor is a small, portable device. You can wear one while you do your normal daily activities. This is usually used to diagnose what is causing palpitations/syncope (passing out).    Follow-Up: 4 months  Any Other Special Instructions Will Be Listed Below (If Applicable).     If you need a refill on your cardiac medications before your next appointment, please call your pharmacy.

## 2017-03-07 NOTE — Progress Notes (Signed)
Cardiology Office Note:    Date:  03/07/2017   ID:  Gabrielle Gilbert, DOB 04-Jun-1976, MRN [id number]  PCP:  Martinique, Betty G, MD  Cardiologist:  Jenean Lindau, MD   Referring MD: Martinique, Betty G, MD    ASSESSMENT:    1. Syncope, unspecified syncope type    PLAN:    In order of problems listed above: I discussed with the patient atrial fibrillation, disease process. Management and therapy including rate and rhythm control, anticoagulation benefits and potential risks were discussed extensively with the patient. Patient had multiple questions which were answered to patient's satisfaction.  1. I reassured the patient about my findings from a cardiovascular standpoint. Overall C seems to be fit and has excellent exercise tolerance. Echocardiogram will be done to assess murmur heard on auscultation. She will undergo stress testing in view of the fact that she had chest pain. Her lipids will be followed by her primary care physician. I would like to see her rhythm or a 48-hour periods she will have a Holter monitoring. She will be seen in follow-up appointment in 4 months or earlier if she has any concerns.   Medication Adjustments/Labs and Tests Ordered: Current medicines are reviewed at length with the patient today.  Concerns regarding medicines are outlined above.  No orders of the defined types were placed in this encounter.  No orders of the defined types were placed in this encounter.    History of Present Illness:    Gabrielle Gilbert is a 41 y.o. female who is being seen today for the evaluation of syncope at the request of Martinique, Malka So, MD. Patient is overall in good health. She is a very active lady. She sees several days ago she woke up in the morning and was working on a maintenance in the bed and subsequently she started feeling tightness in the chest and passed out for an unspecified period of time. Her husband woke her up and was very scared. Subsequently she was fine  and went to work and took care of her activities of daily living. She is referred here for this reason. She has never had any episode like this other than 20 years ago. She exercises with the cross fit system without any problems. At the time of my evaluation she is alert awake oriented and in no distress.  Past Medical History:  Diagnosis Date  . Abnormal Pap smear   . Endometriosis   . Fibroid   . History of fainting spells of unknown cause     Past Surgical History:  Procedure Laterality Date  . CESAREAN SECTION  12/02/2011   Procedure: CESAREAN SECTION;  Surgeon: Marvene Staff, MD;  Location: [facility];  Service: Gynecology;  Laterality: N/A;  . CHOLECYSTECTOMY    . DILATION AND CURETTAGE OF UTERUS    . LAPAROSCOPIC OVARIAN CYSTECTOMY    . LAPAROSCOPY    . LEEP    . MYOMECTOMY    . WISDOM TOOTH EXTRACTION      Current Medications: Current Meds  Medication Sig  . acetaminophen (TYLENOL) 500 MG tablet Take 1,000 mg by mouth every 6 (six) hours as needed. For headache     Allergies:   Patient has no known allergies.   Social History   Social History  . Marital status: Married    Spouse name: N/A  . Number of children: N/A  . Years of education: N/A   Social History Main Topics  . Smoking status: Never Smoker  .  Smokeless tobacco: Never Used  . Alcohol use No  . Drug use: No  . Sexual activity: Yes   Other Topics Concern  . None   Social History Narrative  . None     Family History: The patient's family history includes Heart disease in her father and mother; Hyperlipidemia in her father and mother; Hypertension in her father and mother. There is no history of Anesthesia problems.  ROS:   Please see the history of present illness.    All other systems reviewed and are negative.  EKGs/Labs/Other Studies Reviewed:    The following studies were reviewed today: I reviewed records extensively including primary care physician's referral notes and emergency  room visit which she had for diverticulitis. EKG reveals sinus rhythm and nonspecific ST-T changes.   Recent Labs: 01/24/2017: BUN 10; Creatinine, Ser 0.96; Hemoglobin 15.2; Platelets 235.0; Potassium 4.0; Sodium 140; TSH 2.81  Recent Lipid Panel No results found for: CHOL, TRIG, HDL, CHOLHDL, VLDL, LDLCALC, LDLDIRECT  Physical Exam:    VS:  BP 108/62   Pulse 64   Ht 5' (1.524 m)   Wt 194 lb 3.2 oz (88.1 kg)   SpO2 98%   BMI 37.93 kg/m     Wt Readings from Last 3 Encounters:  03/07/17 194 lb 3.2 oz (88.1 kg)  03/01/17 193 lb 8 oz (87.8 kg)  01/24/17 192 lb 9 oz (87.3 kg)     GEN: Patient is in no acute distress HEENT: Normal NECK: No JVD; No carotid bruits LYMPHATICS: No lymphadenopathy CARDIAC: S1 S2 regular, 2/6 systolic murmur at the apex. RESPIRATORY:  Clear to auscultation without rales, wheezing or rhonchi  ABDOMEN: Soft, non-tender, non-distended MUSCULOSKELETAL:  No edema; No deformity  SKIN: Warm and dry NEUROLOGIC:  Alert and oriented x 3 PSYCHIATRIC:  Normal affect    Signed, Jenean Lindau, MD  03/07/2017 10:05 AM    [place]

## 2017-03-10 ENCOUNTER — Ambulatory Visit (HOSPITAL_BASED_OUTPATIENT_CLINIC_OR_DEPARTMENT_OTHER)
Admission: RE | Admit: 2017-03-10 | Discharge: 2017-03-10 | Disposition: A | Discharge status: Home / Self Care | Payer: 59 | Source: Ambulatory Visit | Attending: Cardiology | Admitting: Cardiology

## 2017-03-10 ENCOUNTER — Ambulatory Visit: Payer: 59

## 2017-03-10 DIAGNOSIS — Z8249 Family history of ischemic heart disease and other diseases of the circulatory system: Secondary | ICD-10-CM | POA: Insufficient documentation

## 2017-03-10 DIAGNOSIS — I253 Aneurysm of heart: Secondary | ICD-10-CM | POA: Diagnosis not present

## 2017-03-10 DIAGNOSIS — R55 Syncope and collapse: Secondary | ICD-10-CM

## 2017-03-10 DIAGNOSIS — E669 Obesity, unspecified: Secondary | ICD-10-CM | POA: Diagnosis not present

## 2017-03-10 NOTE — Progress Notes (Signed)
  Echocardiogram 2D Echocardiogram has been performed.  Tresa Res 03/10/2017, 2:02 PM

## 2017-03-13 ENCOUNTER — Other Ambulatory Visit: Payer: Self-pay

## 2017-03-13 DIAGNOSIS — R55 Syncope and collapse: Secondary | ICD-10-CM

## 2017-03-14 ENCOUNTER — Telehealth: Payer: Self-pay

## 2017-03-14 NOTE — Telephone Encounter (Signed)
Informed patient of the need for a bubble study. The appointment time was added to her date on the 19th; she was informed to arrive 30 mins earlier. No further questions or concerns. Mattie Marlin, RN

## 2017-03-24 ENCOUNTER — Other Ambulatory Visit: Payer: Self-pay

## 2017-03-24 ENCOUNTER — Ambulatory Visit (HOSPITAL_BASED_OUTPATIENT_CLINIC_OR_DEPARTMENT_OTHER)
Admission: RE | Admit: 2017-03-24 | Discharge: 2017-03-24 | Disposition: A | Discharge status: Home / Self Care | Payer: 59 | Source: Ambulatory Visit | Attending: Cardiology | Admitting: Cardiology

## 2017-03-24 ENCOUNTER — Other Ambulatory Visit: Payer: Self-pay | Admitting: Cardiology

## 2017-03-24 DIAGNOSIS — R55 Syncope and collapse: Secondary | ICD-10-CM

## 2017-03-24 DIAGNOSIS — R079 Chest pain, unspecified: Secondary | ICD-10-CM

## 2017-03-24 DIAGNOSIS — R0789 Other chest pain: Principal | ICD-10-CM

## 2017-03-24 NOTE — Progress Notes (Signed)
  Echocardiogram 2D Echocardiogram limited with Bubble study has been performed.  Tresa Res 03/24/2017, 12:33 PM

## 2017-03-24 NOTE — Progress Notes (Signed)
  Echocardiogram Echocardiogram Stress Test has been performed.  Gabrielle Gilbert 03/24/2017, 12:30 PM

## 2017-07-17 ENCOUNTER — Encounter: Payer: Self-pay | Admitting: Cardiology

## 2017-07-17 ENCOUNTER — Ambulatory Visit (INDEPENDENT_AMBULATORY_CARE_PROVIDER_SITE_OTHER): Payer: 59 | Admitting: Cardiology

## 2017-07-17 VITALS — BP 128/70 | HR 48 | Ht 61.0 in | Wt 191.0 lb

## 2017-07-17 DIAGNOSIS — R55 Syncope and collapse: Secondary | ICD-10-CM | POA: Diagnosis not present

## 2017-07-17 NOTE — Patient Instructions (Signed)
Medication Instructions:  Your physician recommends that you continue on your current medications as directed. Please refer to the Current Medication list given to you today.  Labwork: None  Testing/Procedures: None  Follow-Up: Your physician recommends that you schedule a follow-up appointment in: 3 months  Any Other Special Instructions Will Be Listed Below (If Applicable).     If you need a refill on your cardiac medications before your next appointment, please call your pharmacy.   [facility]  Dorise Gangi A, RN, BSN  

## 2017-07-17 NOTE — Progress Notes (Signed)
Cardiology Office Note:    Date:  07/17/2017   ID:  Gabrielle Gilbert, DOB February 08, 1976, MRN [id number]  PCP:  Martinique, Betty G, MD  Cardiologist:  Jenean Lindau, MD   Referring MD: Martinique, Betty G, MD    ASSESSMENT:    1. Syncope, unspecified syncope type    PLAN:    In order of problems listed above:  1. Primary prevention stressed with the patient.  Importance of compliance with diet and medications stressed and she vocalized understanding.  Importance of regular exercise stressed.  She will get lipid check with her primary care physician or if she wants it we can do it for her.  Her cardiovascular evaluation for syncope has been unremarkable.  She is now asymptomatic.  She will follow-up with her primary care physician for noncardiogenic causes.  She will be seen in follow-up appointment in 3 months or earlier if she has any concerns.  She knows to go to the nearest emergency room for any significant concerns.   Medication Adjustments/Labs and Tests Ordered: Current medicines are reviewed at length with the patient today.  Concerns regarding medicines are outlined above.  No orders of the defined types were placed in this encounter.  No orders of the defined types were placed in this encounter.    Chief Complaint  Patient presents with  . Follow-up  . Loss of Consciousness     History of Present Illness:    Gabrielle Gilbert is a 42 y.o. female.  Patient was evaluated for syncopal episode.  Her monitoring has been unremarkable.  She is now exercising on a regular basis.  Holter monitoring and stress test was fine.  She denies any chest pain orthopnea or PND.  She has had normal aspect subsequent to this.  She is keeping herself well-hydrated.  Past Medical History:  Diagnosis Date  . Abnormal Pap smear   . Endometriosis   . Fibroid   . History of fainting spells of unknown cause     Past Surgical History:  Procedure Laterality Date  . CESAREAN SECTION  12/02/2011   Procedure: CESAREAN SECTION;  Surgeon: Marvene Staff, MD;  Location: [facility];  Service: Gynecology;  Laterality: N/A;  . CHOLECYSTECTOMY    . DILATION AND CURETTAGE OF UTERUS    . LAPAROSCOPIC OVARIAN CYSTECTOMY    . LAPAROSCOPY    . LEEP    . MYOMECTOMY    . WISDOM TOOTH EXTRACTION      Current Medications: No outpatient medications have been marked as taking for the 07/17/17 encounter (Office Visit) with Dejai Schubach, Reita Cliche, MD.     Allergies:   Patient has no known allergies.   Social History   Socioeconomic History  . Marital status: Married    Spouse name: None  . Number of children: None  . Years of education: None  . Highest education level: None  Social Needs  . Financial resource strain: None  . Food insecurity - worry: None  . Food insecurity - inability: None  . Transportation needs - medical: None  . Transportation needs - non-medical: None  Occupational History  . None  Tobacco Use  . Smoking status: Never Smoker  . Smokeless tobacco: Never Used  Substance and Sexual Activity  . Alcohol use: No  . Drug use: No  . Sexual activity: Yes  Other Topics Concern  . None  Social History Narrative  . None     Family History: The patient's family history includes Heart  disease in her father and mother; Hyperlipidemia in her father and mother; Hypertension in her father and mother. There is no history of Anesthesia problems.  ROS:   Please see the history of present illness.    All other systems reviewed and are negative.  EKGs/Labs/Other Studies Reviewed:    The following studies were reviewed today: I discussed the findings of the stress test and Holter monitor with the patient at extensive length and questions were answered to her satisfaction.  Her basic blood work including thyroid profile was also unremarkable.   Recent Labs: 01/24/2017: BUN 10; Creatinine, Ser 0.96; Hemoglobin 15.2; Platelets 235.0; Potassium 4.0; Sodium 140; TSH 2.81  Recent  Lipid Panel No results found for: CHOL, TRIG, HDL, CHOLHDL, VLDL, LDLCALC, LDLDIRECT  Physical Exam:    VS:  BP 128/70 (BP Location: Left Arm, Patient Position: Sitting, Cuff Size: Normal)   Pulse (!) 48   Ht 5\' 1"  (1.549 m)   Wt 191 lb (86.6 kg)   SpO2 98%   BMI 36.09 kg/m     Wt Readings from Last 3 Encounters:  07/17/17 191 lb (86.6 kg)  03/07/17 194 lb 3.2 oz (88.1 kg)  03/01/17 193 lb 8 oz (87.8 kg)     GEN: Patient is in no acute distress HEENT: Normal NECK: No JVD; No carotid bruits LYMPHATICS: No lymphadenopathy CARDIAC: Hear sounds regular, 2/6 systolic murmur at the apex. RESPIRATORY:  Clear to auscultation without rales, wheezing or rhonchi  ABDOMEN: Soft, non-tender, non-distended MUSCULOSKELETAL:  No edema; No deformity  SKIN: Warm and dry NEUROLOGIC:  Alert and oriented x 3 PSYCHIATRIC:  Normal affect   Signed, Jenean Lindau, MD  07/17/2017 10:02 AM    [place]

## 2017-08-16 ENCOUNTER — Other Ambulatory Visit: Payer: Self-pay

## 2017-08-16 ENCOUNTER — Other Ambulatory Visit (HOSPITAL_COMMUNITY)
Admission: RE | Admit: 2017-08-16 | Discharge: 2017-08-16 | Disposition: A | Discharge status: Home / Self Care | Payer: 59 | Source: Ambulatory Visit | Attending: Obstetrics and Gynecology | Admitting: Obstetrics and Gynecology

## 2017-08-16 ENCOUNTER — Ambulatory Visit (INDEPENDENT_AMBULATORY_CARE_PROVIDER_SITE_OTHER): Payer: 59 | Admitting: Obstetrics and Gynecology

## 2017-08-16 ENCOUNTER — Encounter: Payer: Self-pay | Admitting: Obstetrics and Gynecology

## 2017-08-16 VITALS — BP 108/72 | HR 62 | Resp 12 | Ht 61.0 in | Wt 192.0 lb

## 2017-08-16 DIAGNOSIS — N939 Abnormal uterine and vaginal bleeding, unspecified: Secondary | ICD-10-CM | POA: Diagnosis not present

## 2017-08-16 DIAGNOSIS — Z124 Encounter for screening for malignant neoplasm of cervix: Secondary | ICD-10-CM

## 2017-08-16 DIAGNOSIS — Z30431 Encounter for routine checking of intrauterine contraceptive device: Secondary | ICD-10-CM | POA: Diagnosis not present

## 2017-08-16 DIAGNOSIS — Z9889 Other specified postprocedural states: Secondary | ICD-10-CM

## 2017-08-16 DIAGNOSIS — Z01419 Encounter for gynecological examination (general) (routine) without abnormal findings: Secondary | ICD-10-CM

## 2017-08-16 DIAGNOSIS — G43109 Migraine with aura, not intractable, without status migrainosus: Secondary | ICD-10-CM

## 2017-08-16 DIAGNOSIS — Z0000 Encounter for general adult medical examination without abnormal findings: Secondary | ICD-10-CM

## 2017-08-16 NOTE — Patient Instructions (Addendum)
EXERCISE AND DIET:  We recommended that you start or continue a regular exercise program for good health. Regular exercise means any activity that makes your heart beat faster and makes you sweat.  We recommend exercising at least 30 minutes per day at least 3 days a week, preferably 4 or 5.  We also recommend a diet low in fat and sugar.  Inactivity, poor dietary choices and obesity can cause diabetes, heart attack, stroke, and kidney damage, among others.    ALCOHOL AND SMOKING:  Women should limit their alcohol intake to no more than 7 drinks/beers/glasses of wine (combined, not each!) per week. Moderation of alcohol intake to this level decreases your risk of breast cancer and liver damage. And of course, no recreational drugs are part of a healthy lifestyle.  And absolutely no smoking or even second hand smoke. Most people know smoking can cause heart and lung diseases, but did you know it also contributes to weakening of your bones? Aging of your skin?  Yellowing of your teeth and nails?  CALCIUM AND VITAMIN D:  Adequate intake of calcium and Vitamin D are recommended.  The recommendations for exact amounts of these supplements seem to change often, but generally speaking 600 mg of calcium (either carbonate or citrate) and 800 units of Vitamin D per day seems prudent. Certain women may benefit from higher intake of Vitamin D.  If you are among these women, your doctor will have told you during your visit.    PAP SMEARS:  Pap smears, to check for cervical cancer or precancers,  have traditionally been done yearly, although recent scientific advances have shown that most women can have pap smears less often.  However, every woman still should have a physical exam from her gynecologist every year. It will include a breast check, inspection of the vulva and vagina to check for abnormal growths or skin changes, a visual exam of the cervix, and then an exam to evaluate the size and shape of the uterus and  ovaries.  And after 40 years of age, a rectal exam is indicated to check for rectal cancers. We will also provide age appropriate advice regarding health maintenance, like when you should have certain vaccines, screening for sexually transmitted diseases, bone density testing, colonoscopy, mammograms, etc.   MAMMOGRAMS:  All women over 40 years old should have a yearly mammogram. Many facilities now offer a "3D" mammogram, which may cost around $50 extra out of pocket. If possible,  we recommend you accept the option to have the 3D mammogram performed.  It both reduces the number of women who will be called back for extra views which then turn out to be normal, and it is better than the routine mammogram at detecting truly abnormal areas.    COLONOSCOPY:  Colonoscopy to screen for colon cancer is recommended for all women at age 50.  We know, you hate the idea of the prep.  We agree, BUT, having colon cancer and not knowing it is worse!!  Colon cancer so often starts as a polyp that can be seen and removed at colonscopy, which can quite literally save your life!  And if your first colonoscopy is normal and you have no family history of colon cancer, most women don't have to have it again for 10 years.  Once every ten years, you can do something that may end up saving your life, right?  We will be happy to help you get it scheduled when you are ready.    Be sure to check your insurance coverage so you understand how much it will cost.  It may be covered as a preventative service at no cost, but you should check your particular policy.      Breast Self-Awareness Breast self-awareness means being familiar with how your breasts look and feel. It involves checking your breasts regularly and reporting any changes to your health care provider. Practicing breast self-awareness is important. A change in your breasts can be a sign of a serious medical problem. Being familiar with how your breasts look and feel allows  you to find any problems early, when treatment is more likely to be successful. All women should practice breast self-awareness, including women who have had breast implants. How to do a breast self-exam One way to learn what is normal for your breasts and whether your breasts are changing is to do a breast self-exam. To do a breast self-exam: Look for Changes  1. Remove all the clothing above your waist. 2. Stand in front of a mirror in a room with good lighting. 3. Put your hands on your hips. 4. Push your hands firmly downward. 5. Compare your breasts in the mirror. Look for differences between them (asymmetry), such as: ? Differences in shape. ? Differences in size. ? Puckers, dips, and bumps in one breast and not the other. 6. Look at each breast for changes in your skin, such as: ? Redness. ? Scaly areas. 7. Look for changes in your nipples, such as: ? Discharge. ? Bleeding. ? Dimpling. ? Redness. ? A change in position. Feel for Changes  Carefully feel your breasts for lumps and changes. It is best to do this while lying on your back on the floor and again while sitting or standing in the shower or tub with soapy water on your skin. Feel each breast in the following way:  Place the arm on the side of the breast you are examining above your head.  Feel your breast with the other hand.  Start in the nipple area and make  inch (2 cm) overlapping circles to feel your breast. Use the pads of your three middle fingers to do this. Apply light pressure, then medium pressure, then firm pressure. The light pressure will allow you to feel the tissue closest to the skin. The medium pressure will allow you to feel the tissue that is a little deeper. The firm pressure will allow you to feel the tissue close to the ribs.  Continue the overlapping circles, moving downward over the breast until you feel your ribs below your breast.  Move one finger-width toward the center of the body.  Continue to use the  inch (2 cm) overlapping circles to feel your breast as you move slowly up toward your collarbone.  Continue the up and down exam using all three pressures until you reach your armpit.  Write Down What You Find  Write down what is normal for each breast and any changes that you find. Keep a written record with breast changes or normal findings for each breast. By writing this information down, you do not need to depend only on memory for size, tenderness, or location. Write down where you are in your menstrual cycle, if you are still menstruating. If you are having trouble noticing differences in your breasts, do not get discouraged. With time you will become more familiar with the variations in your breasts and more comfortable with the exam. How often should I examine my breasts? Examine   your breasts every month. If you are breastfeeding, the best time to examine your breasts is after a feeding or after using a breast pump. If you menstruate, the best time to examine your breasts is 5-7 days after your period is over. During your period, your breasts are lumpier, and it may be more difficult to notice changes. When should I see my health care provider? See your health care provider if you notice:  A change in shape or size of your breasts or nipples.  A change in the skin of your breast or nipples, such as a reddened or scaly area.  Unusual discharge from your nipples.  A lump or thick area that was not there before.  Pain in your breasts.  Anything that concerns you.  This information is not intended to replace advice given to you by your health care provider. Make sure you discuss any questions you have with your health care provider. Document Released: 05/23/2005 Document Revised: 10/29/2015 Document Reviewed: 04/12/2015 [employer] Interactive Patient Education  2018 [employer]  

## 2017-08-16 NOTE — Progress Notes (Addendum)
42 y.o. [id number] MarriedCaucasianF here for annual exam.   She can't tolerate OCP's. Initially treated with a copper IUD, had very heavy bleeding. She then had a mirena, it was taken out 2017 when she had an office hysteroscopy. A new IUD was placed, not sure what type. After insertion of this IUD she had monthly cycles until September, since then she has had spotting every other week for a week. It is annoying for her. She doesn't have sex when she is bleeding.  Last ultrasound was in 2017.  She feel moody with the IUD.  She previously tried antidepressants for PMDD. She got migraines with aura with the nuvaring.  Sexually active, occasional deep dyspareunia. Positional.  Period Cycle (Days): 14 Period Duration (Days): 7  Period Pattern: (!) Irregular Menstrual Flow: Light Dysmenorrhea: (!) Moderate Dysmenorrhea Symptoms: Cramping  Patient's last menstrual period was 07/12/2017.          Sexually active: Yes.    The current method of family planning is IUD.    Exercising: Yes.    crossfit Smoker:  no  Health Maintenance: Pap:  9/17 with Dr. Garwin Brothers, normal per patient History of abnormal Pap:  yes MMG:  n/a Colonoscopy:  n/a BMD:   n/a TDaP:  12/03/11  Gardasil: n/a   reports that  has never smoked. she has never used smokeless tobacco. She reports that she does not drink alcohol or use drugs. Just occasional ETOH. 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 Kids are 80 (daughter) and 5 (son).   Past Medical History:  Diagnosis Date  . Abnormal Pap smear   . Dysmenorrhea   . Endometriosis   . Fibroid   . Heart murmur   . History of fainting spells of unknown cause   . Hormone disorder   . Infertility, female   . Migraine with aura     Past Surgical History:  Procedure Laterality Date  . CERVICAL BIOPSY  W/ LOOP ELECTRODE EXCISION    . CESAREAN SECTION  12/02/2011   Procedure: CESAREAN SECTION;  Surgeon: Marvene Staff, MD;  Location: [facility];  Service: Gynecology;   Laterality: N/A;  . CHOLECYSTECTOMY    . DILATION AND CURETTAGE OF UTERUS    . HYSTEROSCOPY    . LAPAROSCOPIC OVARIAN CYSTECTOMY    . LAPAROSCOPY    . LEEP    . MYOMECTOMY    . WISDOM TOOTH EXTRACTION      Current Outpatient Medications  Medication Sig Dispense Refill  . acetaminophen (TYLENOL) 500 MG tablet Take 1,000 mg by mouth every 6 (six) hours as needed. For headache    . ASPIRIN PO Take 325 mg by mouth.     No current facility-administered medications for this visit.     Family History  Problem Relation Age of Onset  . Hyperlipidemia Mother   . Heart disease Mother   . Hypertension Mother   . Fibroids Mother   . Hyperlipidemia Father   . Heart disease Father   . Hypertension Father   . Heart disease Sister   . Anesthesia problems Neg Hx     Review of Systems  Constitutional: Negative.   HENT: Negative.   Eyes: Negative.   Respiratory: Negative.   Cardiovascular: Negative.   Gastrointestinal: Negative.   Endocrine: Negative.   Genitourinary: Positive for menstrual problem.  Allergic/Immunologic: Negative.   Neurological: Negative.   Hematological: Negative.   Psychiatric/Behavioral: Negative.     Exam:   BP 108/72 (BP Location: Right Arm, Patient  Position: Sitting, Cuff Size: Normal)   Pulse 62   Resp 12   Ht 5\' 1"  (1.549 m)   Wt 192 lb (87.1 kg)   LMP 07/12/2017   BMI 36.28 kg/m   Weight change: @WEIGHTCHANGE @ Height:   Height: 5\' 1"  (154.9 cm)  Ht Readings from Last 3 Encounters:  08/16/17 5\' 1"  (1.549 m)  07/17/17 5\' 1"  (1.549 m)  03/07/17 5' (1.524 m)    General appearance: alert, cooperative and appears stated age Head: Normocephalic, without obvious abnormality, atraumatic Neck: no adenopathy, supple, symmetrical, trachea midline and thyroid normal to inspection and palpation Lungs: clear to auscultation bilaterally Cardiovascular: regular rate and rhythm Breasts: normal appearance, no masses or tenderness Abdomen: soft, non-tender;  non distended,  no masses,  no organomegaly Extremities: extremities normal, atraumatic, no cyanosis or edema Skin: Skin color, texture, turgor normal. No rashes or lesions Lymph nodes: Cervical, supraclavicular, and axillary nodes normal. No abnormal inguinal nodes palpated Neurologic: Grossly normal   Pelvic: External genitalia:  no lesions              Urethra:  normal appearing urethra with no masses, tenderness or lesions              Bartholins and Skenes: normal                 Vagina: normal appearing vagina with normal color and discharge, no lesions              Cervix: no lesions and IUD strings 3 cm (suspect kyleena)               Bimanual Exam:  Uterus:  normal size, contour, position, consistency, mobility, non-tender and anteverted              Adnexa: no mass, fullness, tenderness               Rectovaginal: Confirms               Anus:  normal sphincter tone, no lesions  Chaperone was present for exam.  A:  Well Woman with normal exam  Irregular bleeding with IUD  P:   Pap with hpv  TSH  Get copy of her records from prior GYN, need to know what kind of IUD she has  We discussed options for treatment: do nothing, place mirena (think she has a kyleena), endometrial ablation (depending on further evaluation with her prior surgeryies), and TLH/BS  Not a candidate for OCP's, declines depo-provera  Discussed breast self exam  Discussed calcium and vit D intake  Screening labs and tsh  Need to review myomectomy and C/S op notes  Mammogram after she turns 42  Addendum: Old records reviewed She has a kyleena IUD, inserted 11/24/15 She had a robotic myomectomy in 3/12 with excision and ablation of stare I endometriosis.  C/S on 12/02/11, the note is not in the records that were sent or in [place], will request them again.   At the time of the myomectomy, the anterior/fundal myoma was noted to be next to, but not entering the endometrium.   Addendum: C/S op note from  12/02/11 was reviewed Bladder was adherent to the LUS, taken down bluntly. Normal adnexa were noted bilaterally. She had a 3.5 cm anterior suberosal fibroid. Interceed was placed over the uterine incision.

## 2017-08-17 LAB — LIPID PANEL
Chol/HDL Ratio: 3.6 ratio (ref 0.0–4.4)
Cholesterol, Total: 196 mg/dL (ref 100–199)
HDL: 55 mg/dL (ref >39)
LDL Calculated: 118 mg/dL — ABNORMAL HIGH (ref 0–99)
Triglycerides: 117 mg/dL (ref 0–149)
VLDL CHOLESTEROL CAL: 23 mg/dL (ref 5–40)

## 2017-08-17 LAB — COMPREHENSIVE METABOLIC PANEL
ALBUMIN: 4.4 g/dL (ref 3.5–5.5)
ALT: 16 IU/L (ref 0–32)
AST: 19 IU/L (ref 0–40)
Albumin/Globulin Ratio: 1.7 (ref 1.2–2.2)
Alkaline Phosphatase: 59 IU/L (ref 39–117)
BILIRUBIN TOTAL: 0.5 mg/dL (ref 0.0–1.2)
BUN / CREAT RATIO: 13 (ref 9–23)
BUN: 16 mg/dL (ref 6–20)
CHLORIDE: 103 mmol/L (ref 96–106)
CO2: 24 mmol/L (ref 20–29)
Calcium: 9.5 mg/dL (ref 8.7–10.2)
Creatinine, Ser: 1.26 mg/dL — ABNORMAL HIGH (ref 0.57–1.00)
GFR calc non Af Amer: 54 mL/min/{1.73_m2} — ABNORMAL LOW (ref >59)
GFR, EST AFRICAN AMERICAN: 62 mL/min/{1.73_m2} (ref >59)
Globulin, Total: 2.6 g/dL (ref 1.5–4.5)
Glucose: 91 mg/dL (ref 65–99)
POTASSIUM: 3.9 mmol/L (ref 3.5–5.2)
SODIUM: 144 mmol/L (ref 134–144)
TOTAL PROTEIN: 7 g/dL (ref 6.0–8.5)

## 2017-08-17 LAB — CBC
Hematocrit: 47.1 % — ABNORMAL HIGH (ref 34.0–46.6)
Hemoglobin: 15.2 g/dL (ref 11.1–15.9)
MCH: 29.4 pg (ref 26.6–33.0)
MCHC: 32.3 g/dL (ref 31.5–35.7)
MCV: 91 fL (ref 79–97)
Platelets: 270 10*3/uL (ref 150–379)
RBC: 5.17 x10E6/uL (ref 3.77–5.28)
RDW: 13.2 % (ref 12.3–15.4)
WBC: 8.8 10*3/uL (ref 3.4–10.8)

## 2017-08-17 LAB — TSH: TSH: 3.37 u[IU]/mL (ref 0.450–4.500)

## 2017-08-18 LAB — CYTOLOGY - PAP
Diagnosis: NEGATIVE
HPV: NOT DETECTED

## 2017-09-08 DIAGNOSIS — D225 Melanocytic nevi of trunk: Secondary | ICD-10-CM | POA: Diagnosis not present

## 2017-09-08 DIAGNOSIS — D1801 Hemangioma of skin and subcutaneous tissue: Secondary | ICD-10-CM | POA: Diagnosis not present

## 2017-09-08 DIAGNOSIS — L821 Other seborrheic keratosis: Secondary | ICD-10-CM | POA: Diagnosis not present

## 2017-09-11 ENCOUNTER — Telehealth: Payer: Self-pay | Admitting: Obstetrics and Gynecology

## 2017-09-11 DIAGNOSIS — N939 Abnormal uterine and vaginal bleeding, unspecified: Secondary | ICD-10-CM

## 2017-09-11 NOTE — Telephone Encounter (Addendum)
Spoke with patient. Advised of message as seen below from [facility] [place]. Patient verbalizes understanding. PUS scheduled for 4/16 at 12:30 pm with 1 pm consult with Dr.Jertson. Patient is agreeable to date and time. Order placed.  Routing to provider for final review. Patient agreeable to disposition. Will close encounter.

## 2017-09-11 NOTE — Telephone Encounter (Signed)
Please let the patient know that I reviewed her op note from her C/S and her visit when she had her IUD placed. It was a Media planner.  I think we should set her up for an ultrasound in the office to better evaluate her uterus and our options for treatment. She has a h/o fibroids, I want to make sure there aren't any fibroids in the lining of her uterus. I also want to evaluate the thickness of her myometrium given her prior surgeries. Please place the order after speaking with the patient.  The simplest option (after evaluating her) would be to take the [facility] out and change back to the [employer]

## 2017-09-19 ENCOUNTER — Ambulatory Visit (INDEPENDENT_AMBULATORY_CARE_PROVIDER_SITE_OTHER): Payer: 59

## 2017-09-19 ENCOUNTER — Encounter: Payer: Self-pay | Admitting: Obstetrics and Gynecology

## 2017-09-19 ENCOUNTER — Other Ambulatory Visit: Payer: Self-pay

## 2017-09-19 ENCOUNTER — Ambulatory Visit: Payer: 59 | Admitting: Obstetrics and Gynecology

## 2017-09-19 VITALS — BP 104/62 | HR 60 | Resp 14 | Wt 191.0 lb

## 2017-09-19 DIAGNOSIS — D251 Intramural leiomyoma of uterus: Secondary | ICD-10-CM | POA: Diagnosis not present

## 2017-09-19 DIAGNOSIS — N939 Abnormal uterine and vaginal bleeding, unspecified: Secondary | ICD-10-CM

## 2017-09-19 NOTE — Progress Notes (Signed)
GYNECOLOGY  VISIT   HPI: 42 y.o.   Married  Caucasian  female   [phone] with Patient's last menstrual period was 09/05/2017.   here for further evaluation of abnormal bleeding. Prior to contraception, she c/o menorrhagia. She has a kyleena IUD and has been spotting every other week since September with moderate cramps.  She had a normal pap, TSH and CBC last month. She can't use OCP's secondary to a h/o migraines with aura. She has a h/o a robotic myomectomy in 3/12 with removal and ablation of stage one endometriosis. She had a C/S in 6/13.  GYNECOLOGIC HISTORY: Patient's last menstrual period was 09/05/2017. Contraception:IUD  Menopausal hormone therapy: none         OB History    Gravida  5   Para  2   Term  2   Preterm  0   AB  3   Living  2     SAB  3   TAB  0   Ectopic  0   Multiple  0   Live Births  1              Patient Active Problem List   Diagnosis Date Noted  . Migraine with aura   . Diverticula of colon 03/01/2017  . Class 2 obesity with body mass index (BMI) of 37.0 to 37.9 in adult 03/01/2017  . Syncope 02/28/2017  . Routine health maintenance 05/01/2012  . Postpartum care following cesarean delivery (6/28) 12/02/2011    Past Medical History:  Diagnosis Date  . Abnormal Pap smear   . Dysmenorrhea   . Endometriosis   . Fibroid   . Heart murmur   . History of fainting spells of unknown cause   . Hormone disorder   . Infertility, female   . Migraine with aura     Past Surgical History:  Procedure Laterality Date  . CERVICAL BIOPSY  W/ LOOP ELECTRODE EXCISION    . CESAREAN SECTION  12/02/2011   Procedure: CESAREAN SECTION;  Surgeon: Marvene Staff, MD;  Location: [facility];  Service: Gynecology;  Laterality: N/A;  . CHOLECYSTECTOMY    . DILATION AND CURETTAGE OF UTERUS    . HYSTEROSCOPY    . LAPAROSCOPIC OVARIAN CYSTECTOMY    . LAPAROSCOPY    . LEEP    . MYOMECTOMY     robotic myomectomy, Dr Kerin Perna   . WISDOM TOOTH  EXTRACTION      Current Outpatient Medications  Medication Sig Dispense Refill  . acetaminophen (TYLENOL) 500 MG tablet Take 1,000 mg by mouth every 6 (six) hours as needed. For headache    . ASPIRIN PO Take 325 mg by mouth.    . Levonorgestrel (KYLEENA) 19.5 MG IUD by Intrauterine route.     No current facility-administered medications for this visit.      ALLERGIES: Patient has no known allergies.  Family History  Problem Relation Age of Onset  . Hyperlipidemia Mother   . Heart disease Mother   . Hypertension Mother   . Fibroids Mother   . Hyperlipidemia Father   . Heart disease Father   . Hypertension Father   . Heart disease Sister   . Anesthesia problems Neg Hx     Social History   Socioeconomic History  . Marital status: Married    Spouse name: Not on file  . Number of children: Not on file  . Years of education: Not on file  . Highest education level: Not on file  Occupational History  . Not on file  Social Needs  . Financial resource strain: Not on file  . Food insecurity:    Worry: Not on file    Inability: Not on file  . Transportation needs:    Medical: Not on file    Non-medical: Not on file  Tobacco Use  . Smoking status: Never Smoker  . Smokeless tobacco: Never Used  Substance and Sexual Activity  . Alcohol use: No  . Drug use: No  . Sexual activity: Yes    Birth control/protection: IUD  Lifestyle  . Physical activity:    Days per week: Not on file    Minutes per session: Not on file  . Stress: Not on file  Relationships  . Social connections:    Talks on phone: Not on file    Gets together: Not on file    Attends religious service: Not on file    Active member of club or organization: Not on file    Attends meetings of clubs or organizations: Not on file    Relationship status: Not on file  . Intimate partner violence:    Fear of current or ex partner: Not on file    Emotionally abused: Not on file    Physically abused: Not on file     Forced sexual activity: Not on file  Other Topics Concern  . Not on file  Social History Narrative  . Not on file    Review of Systems  Constitutional: Negative.   HENT: Negative.   Eyes: Negative.   Respiratory: Negative.   Cardiovascular: Negative.   Gastrointestinal: Negative.   Genitourinary: Negative.   Musculoskeletal: Negative.   Skin: Negative.   Neurological: Negative.   Endo/Heme/Allergies: Negative.   Psychiatric/Behavioral: Negative.     PHYSICAL EXAMINATION:    BP 104/62 (BP Location: Right Arm, Patient Position: Sitting, Cuff Size: Normal)   Pulse 60   Resp 14   Wt 191 lb (86.6 kg)   LMP 09/05/2017   BMI 36.09 kg/m     General appearance: alert, cooperative and appears stated age  Ultrasound images were reviewed with the patient  ASSESSMENT AUB with the kyleena IUD, h/o menorrhagia without contraception. Thin stripe on U/S with no intracavitary myomas. She is not a candidate for OCP's IUD check, in place on ultrasound. I don't think changing the Verdia Kuba will help her because her stripe is already so thin    PLAN Declines depo-provera Aware of the options of doing nothing, pull IUD, possible endometrial ablation, the mini-pill, TLH/BS We discussed that there would potentially be increased risk of the ablation with her prior uterine surgery. Her uterine wall looks uniformly thick throughout on ultrasound. No defects noted other than C/S scar (not thin in that area either) She is interested in TLH/BS, would like to schedule at the end of the year   An After Visit Summary was printed and given to the patient.  ~15 minutes face to face time of which over 50% was spent in counseling.

## 2017-10-24 DIAGNOSIS — L723 Sebaceous cyst: Secondary | ICD-10-CM | POA: Diagnosis not present

## 2017-12-28 ENCOUNTER — Telehealth: Payer: Self-pay | Admitting: Obstetrics and Gynecology

## 2017-12-28 NOTE — Telephone Encounter (Signed)
Call to patient. Surgery dates for December discussed. Interested in 12-2 or 05-14-18. Scheduling and cancellation policy reviewed.  Patient will call back when date selected and ready to proceed with scheduling.

## 2017-12-28 NOTE — Telephone Encounter (Signed)
Patient wanting to schedule hysterectomy for December.

## 2018-01-04 NOTE — Telephone Encounter (Signed)
Patient returning call to Suzy. °

## 2018-01-08 NOTE — Telephone Encounter (Signed)
Call returned to patient to review benefits for recommended surgery. Left a voicemail message requesting a return call

## 2018-01-08 NOTE — Telephone Encounter (Signed)
Spoke with patient regarding benefits for recommended surgery. Patient understood and agreeable. Patient has confirmed and is ready to proceed with scheduling. Patient aware this is professional benefit only. Patient aware will be contacted by hospital for separate benefits. Forwarding to Conservation officer, historic buildings for scheduling.  Routing to Lamont Snowball, RN

## 2018-01-10 NOTE — Telephone Encounter (Signed)
Call to patient. Confirmed patient request for surgery date of 05-07-18.  Surgery instruction sheet reviewed and printed copy will be mailed.   Routing to provider for final review. Will close encounter.

## 2018-04-10 NOTE — Progress Notes (Signed)
GYNECOLOGY  VISIT   HPI: 42 y.o.   Married White or Caucasian Not Hispanic or Latino  female   [phone] with Patient's last menstrual period was 04/05/2018 (exact date).   here for surgery consult.  The patient has had AUB and cramping with the [facility] IUD, h/o menorrhagia without contraception. She has a fibroid uterus (multiple small fibroids). No intracavitary myomas, thin lining and IUD in place on ultrasound. Can't take OCP's. Not anemic  Last pap: 08/15/17 negative with negative hpv  H/O robotic myomectomy in 3/12 with removal and ablation of stage one endometriosis. Had a primary c/s in 6/13. No significant adhesions were noted.   GYNECOLOGIC HISTORY: Patient's last menstrual period was 04/05/2018 (exact date). Contraception: IUD Menopausal hormone therapy: None        OB History    Gravida  5   Para  2   Term  2   Preterm  0   AB  3   Living  2     SAB  3   TAB  0   Ectopic  0   Multiple  0   Live Births  1              Patient Active Problem List   Diagnosis Date Noted  . Migraine with aura   . Diverticula of colon 03/01/2017  . Class 2 obesity with body mass index (BMI) of 37.0 to 37.9 in adult 03/01/2017  . Syncope 02/28/2017  . Routine health maintenance 05/01/2012  . Postpartum care following cesarean delivery (6/28) 12/02/2011    Past Medical History:  Diagnosis Date  . Abnormal Pap smear   . Dysmenorrhea   . Endometriosis   . Fibroid   . Heart murmur   . History of fainting spells of unknown cause   . Hormone disorder   . Infertility, female   . Migraine with aura     Past Surgical History:  Procedure Laterality Date  . CERVICAL BIOPSY  W/ LOOP ELECTRODE EXCISION    . CESAREAN SECTION  12/02/2011   Procedure: CESAREAN SECTION;  Surgeon: Marvene Staff, MD;  Location: [facility];  Service: Gynecology;  Laterality: N/A;  . CHOLECYSTECTOMY    . DILATION AND CURETTAGE OF UTERUS    . HYSTEROSCOPY    . LAPAROSCOPIC OVARIAN  CYSTECTOMY    . LAPAROSCOPY    . LEEP    . MYOMECTOMY     robotic myomectomy, Dr Kerin Perna   . WISDOM TOOTH EXTRACTION    Laparoscopy for ovarian cystectomy in 1999, Laparoscopy after that for endometriosis, then robotic myomectomy  Current Outpatient Medications  Medication Sig Dispense Refill  . Levonorgestrel (KYLEENA) 19.5 MG IUD by Intrauterine route.    . Probiotic Product (PROBIOTIC DAILY PO) Take by mouth.     No current facility-administered medications for this visit.      ALLERGIES: Patient has no known allergies.  Family History  Problem Relation Age of Onset  . Hyperlipidemia Mother   . Heart disease Mother   . Hypertension Mother   . Fibroids Mother   . Hyperlipidemia Father   . Heart disease Father   . Hypertension Father   . Heart disease Sister   . Anesthesia problems Neg Hx     Social History   Socioeconomic History  . Marital status: Married    Spouse name: Not on file  . Number of children: Not on file  . Years of education: Not on file  . Highest education level: Not  on file  Occupational History  . Not on file  Social Needs  . Financial resource strain: Not on file  . Food insecurity:    Worry: Not on file    Inability: Not on file  . Transportation needs:    Medical: Not on file    Non-medical: Not on file  Tobacco Use  . Smoking status: Never Smoker  . Smokeless tobacco: Never Used  Substance and Sexual Activity  . Alcohol use: No  . Drug use: No  . Sexual activity: Yes    Birth control/protection: IUD  Lifestyle  . Physical activity:    Days per week: Not on file    Minutes per session: Not on file  . Stress: Not on file  Relationships  . Social connections:    Talks on phone: Not on file    Gets together: Not on file    Attends religious service: Not on file    Active member of club or organization: Not on file    Attends meetings of clubs or organizations: Not on file    Relationship status: Not on file  . Intimate  partner violence:    Fear of current or ex partner: Not on file    Emotionally abused: Not on file    Physically abused: Not on file    Forced sexual activity: Not on file  Other Topics Concern  . Not on file  Social History Narrative  . Not on file  No tobacco, occasional ETOH. Kids are 12 and 6. 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   Review of Systems  Constitutional: Negative.   HENT: Negative.   Eyes: Negative.   Respiratory: Negative.   Cardiovascular: Negative.   Gastrointestinal: Negative.   Genitourinary: Negative.   Musculoskeletal: Negative.   Skin: Negative.   Neurological: Negative.   Endo/Heme/Allergies: Negative.   Psychiatric/Behavioral: Negative.     PHYSICAL EXAMINATION:    BP 116/84 (BP Location: Right Arm, Patient Position: Sitting, Cuff Size: Normal)   Pulse (!) 56   Wt 196 lb 6.4 oz (89.1 kg)   LMP 04/05/2018 (Exact Date)   BMI 37.11 kg/m     General appearance: alert, cooperative and appears stated age Neck: no adenopathy, supple, symmetrical, trachea midline and thyroid normal to inspection and palpation Heart: regular rate and rhythm Lungs: CTAB Abdomen: soft, non-tender; bowel sounds normal; no masses,  no organomegaly Extremities: normal, atraumatic, no cyanosis Skin: normal color, texture and turgor, no rashes or lesions Lymph: normal cervical supraclavicular and inguinal nodes Neurologic: grossly normal    ASSESSMENT Fibroid uterus, AUB and cramping with the kyleena IUD. Can't take OCP's, desires definitive surgery H/O mild endometriosis and myomectomy. No significant adhesions noted at her C/S    PLAN Discussed total laparoscopic hysterectomy, bilateral salpingectomy and cystoscopy. Reviewed the risks of the procedure, including infection, bleeding, damage to bowel/badder/vessels/ureters.  Discussed the possible need for laparotomy. Discussed post operative recovery and risk of cuff dehiscence. All of her questions were answered     An After Visit Summary was printed and given to the patient.

## 2018-04-12 ENCOUNTER — Encounter: Payer: Self-pay | Admitting: Obstetrics and Gynecology

## 2018-04-12 ENCOUNTER — Ambulatory Visit (INDEPENDENT_AMBULATORY_CARE_PROVIDER_SITE_OTHER): Payer: 59 | Admitting: Obstetrics and Gynecology

## 2018-04-12 VITALS — BP 116/84 | HR 56 | Wt 196.4 lb

## 2018-04-12 DIAGNOSIS — D259 Leiomyoma of uterus, unspecified: Secondary | ICD-10-CM

## 2018-04-12 DIAGNOSIS — N939 Abnormal uterine and vaginal bleeding, unspecified: Secondary | ICD-10-CM

## 2018-04-12 DIAGNOSIS — N946 Dysmenorrhea, unspecified: Secondary | ICD-10-CM

## 2018-04-12 NOTE — H&P (Signed)
GYNECOLOGY  VISIT   HPI: 42 y.o.   Married White or Caucasian Not Hispanic or Latino  female   [phone] with Patient's last menstrual period was 04/05/2018 (exact date).   here for surgery consult.  The patient has had AUB and cramping with the [facility] IUD, h/o menorrhagia without contraception. She has a fibroid uterus (multiple small fibroids). No intracavitary myomas, thin lining and IUD in place on ultrasound. Can't take OCP's. Not anemic  Last pap: 08/15/17 negative with negative hpv  H/O robotic myomectomy in 3/12 with removal and ablation of stage one endometriosis. Had a primary c/s in 6/13. No significant adhesions were noted.   GYNECOLOGIC HISTORY: Patient's last menstrual period was 04/05/2018 (exact date). Contraception: IUD Menopausal hormone therapy: None        OB History    Gravida  5   Para  2   Term  2   Preterm  0   AB  3   Living  2     SAB  3   TAB  0   Ectopic  0   Multiple  0   Live Births  1              Patient Active Problem List   Diagnosis Date Noted  . Migraine with aura   . Diverticula of colon 03/01/2017  . Class 2 obesity with body mass index (BMI) of 37.0 to 37.9 in adult 03/01/2017  . Syncope 02/28/2017  . Routine health maintenance 05/01/2012  . Postpartum care following cesarean delivery (6/28) 12/02/2011    Past Medical History:  Diagnosis Date  . Abnormal Pap smear   . Dysmenorrhea   . Endometriosis   . Fibroid   . Heart murmur   . History of fainting spells of unknown cause   . Hormone disorder   . Infertility, female   . Migraine with aura     Past Surgical History:  Procedure Laterality Date  . CERVICAL BIOPSY  W/ LOOP ELECTRODE EXCISION    . CESAREAN SECTION  12/02/2011   Procedure: CESAREAN SECTION;  Surgeon: Marvene Staff, MD;  Location: [facility];  Service: Gynecology;  Laterality: N/A;  . CHOLECYSTECTOMY    . DILATION AND CURETTAGE OF UTERUS    . HYSTEROSCOPY    . LAPAROSCOPIC OVARIAN  CYSTECTOMY    . LAPAROSCOPY    . LEEP    . MYOMECTOMY     robotic myomectomy, Dr Kerin Perna   . WISDOM TOOTH EXTRACTION    Laparoscopy for ovarian cystectomy in 1999, Laparoscopy after that for endometriosis, then robotic myomectomy  Current Outpatient Medications  Medication Sig Dispense Refill  . Levonorgestrel (KYLEENA) 19.5 MG IUD by Intrauterine route.    . Probiotic Product (PROBIOTIC DAILY PO) Take by mouth.     No current facility-administered medications for this visit.      ALLERGIES: Patient has no known allergies.  Family History  Problem Relation Age of Onset  . Hyperlipidemia Mother   . Heart disease Mother   . Hypertension Mother   . Fibroids Mother   . Hyperlipidemia Father   . Heart disease Father   . Hypertension Father   . Heart disease Sister   . Anesthesia problems Neg Hx     Social History   Socioeconomic History  . Marital status: Married    Spouse name: Not on file  . Number of children: Not on file  . Years of education: Not on file  . Highest education level: Not  on file  Occupational History  . Not on file  Social Needs  . Financial resource strain: Not on file  . Food insecurity:    Worry: Not on file    Inability: Not on file  . Transportation needs:    Medical: Not on file    Non-medical: Not on file  Tobacco Use  . Smoking status: Never Smoker  . Smokeless tobacco: Never Used  Substance and Sexual Activity  . Alcohol use: No  . Drug use: No  . Sexual activity: Yes    Birth control/protection: IUD  Lifestyle  . Physical activity:    Days per week: Not on file    Minutes per session: Not on file  . Stress: Not on file  Relationships  . Social connections:    Talks on phone: Not on file    Gets together: Not on file    Attends religious service: Not on file    Active member of club or organization: Not on file    Attends meetings of clubs or organizations: Not on file    Relationship status: Not on file  . Intimate  partner violence:    Fear of current or ex partner: Not on file    Emotionally abused: Not on file    Physically abused: Not on file    Forced sexual activity: Not on file  Other Topics Concern  . Not on file  Social History Narrative  . Not on file  No tobacco, occasional ETOH. Kids are 12 and 6. 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   Review of Systems  Constitutional: Negative.   HENT: Negative.   Eyes: Negative.   Respiratory: Negative.   Cardiovascular: Negative.   Gastrointestinal: Negative.   Genitourinary: Negative.   Musculoskeletal: Negative.   Skin: Negative.   Neurological: Negative.   Endo/Heme/Allergies: Negative.   Psychiatric/Behavioral: Negative.     PHYSICAL EXAMINATION:    BP 116/84 (BP Location: Right Arm, Patient Position: Sitting, Cuff Size: Normal)   Pulse (!) 56   Wt 196 lb 6.4 oz (89.1 kg)   LMP 04/05/2018 (Exact Date)   BMI 37.11 kg/m     General appearance: alert, cooperative and appears stated age Neck: no adenopathy, supple, symmetrical, trachea midline and thyroid normal to inspection and palpation Heart: regular rate and rhythm Lungs: CTAB Abdomen: soft, non-tender; bowel sounds normal; no masses,  no organomegaly Extremities: normal, atraumatic, no cyanosis Skin: normal color, texture and turgor, no rashes or lesions Lymph: normal cervical supraclavicular and inguinal nodes Neurologic: grossly normal    ASSESSMENT Fibroid uterus, AUB and cramping with the kyleena IUD. Can't take OCP's, desires definitive surgery H/O mild endometriosis and myomectomy. No significant adhesions noted at her C/S    PLAN Discussed total laparoscopic hysterectomy, bilateral salpingectomy and cystoscopy. Reviewed the risks of the procedure, including infection, bleeding, damage to bowel/badder/vessels/ureters.  Discussed the possible need for laparotomy. Discussed post operative recovery and risk of cuff dehiscence. All of her questions were answered     An After Visit Summary was printed and given to the patient.  Addendum: the patient would like to stay overnight at the hospital. 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

## 2018-04-25 NOTE — Patient Instructions (Addendum)
Gabrielle Gilbert  04/25/2018   Your procedure is scheduled on: 05-07-18    Report to [facility] Main  Entrance    Report to Admitting at 5:30 AM    Call this number if you have problems the morning of surgery [phone]    Remember: Do not eat food or drink liquids :After Midnight.    BRUSH YOUR TEETH MORNING OF SURGERY AND RINSE YOUR MOUTH OUT, NO CHEWING GUM CANDY OR MINTS.     Take these medicines the morning of surgery with A SIP OF WATER: None                                You may not have any metal on your body including hair pins and              piercings  Do not wear jewelry, make-up, lotions, powders or perfumes, deodorant             Do not wear nail polish.  Do not shave  48 hours prior to surgery.     Do not bring valuables to the hospital. [place].  Contacts, dentures or bridgework may not be worn into surgery.  Leave suitcase in the car. After surgery it may be brought to your room.    Special Instructions: N/A              Please read over the following fact sheets you were given: _____________________________________________________________________           [facility] - Preparing for Surgery Before surgery, you can play an important role.  Because skin is not sterile, your skin needs to be as free of germs as possible.  You can reduce the number of germs on your skin by washing with CHG (chlorahexidine gluconate) soap before surgery.  CHG is an antiseptic cleaner which kills germs and bonds with the skin to continue killing germs even after washing. Please DO NOT use if you have an allergy to CHG or antibacterial soaps.  If your skin becomes reddened/irritated stop using the CHG and inform your nurse when you arrive at Short Stay. Do not shave (including legs and underarms) for at least 48 hours prior to the first CHG shower.  You may shave your face/neck. Please follow these  instructions carefully:  1.  Shower with CHG Soap the night before surgery and the  morning of Surgery.  2.  If you choose to wash your hair, wash your hair first as usual with your  normal  shampoo.  3.  After you shampoo, rinse your hair and body thoroughly to remove the  shampoo.                           4.  Use CHG as you would any other liquid soap.  You can apply chg directly  to the skin and wash                       Gently with a scrungie or clean washcloth.  5.  Apply the CHG Soap to your body ONLY FROM THE NECK DOWN.   Do not use on face/ open  Wound or open sores. Avoid contact with eyes, ears mouth and genitals (private parts).                       Wash face,  Genitals (private parts) with your normal soap.             6.  Wash thoroughly, paying special attention to the area where your surgery  will be performed.  7.  Thoroughly rinse your body with warm water from the neck down.  8.  DO NOT shower/wash with your normal soap after using and rinsing off  the CHG Soap.                9.  Pat yourself dry with a clean towel.            10.  Wear clean pajamas.            11.  Place clean sheets on your bed the night of your first shower and do not  sleep with pets. Day of Surgery : Do not apply any lotions/deodorants the morning of surgery.  Please wear clean clothes to the hospital/surgery center.  FAILURE TO FOLLOW THESE INSTRUCTIONS MAY RESULT IN THE CANCELLATION OF YOUR SURGERY PATIENT SIGNATURE_________________________________  NURSE SIGNATURE__________________________________  ________________________________________________________________________

## 2018-04-25 NOTE — Progress Notes (Signed)
03-24-17 (Epic) ECHO

## 2018-04-30 ENCOUNTER — Other Ambulatory Visit: Payer: Self-pay

## 2018-04-30 ENCOUNTER — Encounter (HOSPITAL_COMMUNITY): Payer: Self-pay

## 2018-04-30 ENCOUNTER — Encounter (HOSPITAL_COMMUNITY)
Admission: RE | Admit: 2018-04-30 | Discharge: 2018-04-30 | Disposition: A | Discharge status: Home / Self Care | Payer: 59 | Source: Ambulatory Visit | Attending: Obstetrics and Gynecology | Admitting: Obstetrics and Gynecology

## 2018-04-30 DIAGNOSIS — D259 Leiomyoma of uterus, unspecified: Secondary | ICD-10-CM | POA: Diagnosis not present

## 2018-04-30 DIAGNOSIS — Z01818 Encounter for other preprocedural examination: Secondary | ICD-10-CM | POA: Diagnosis not present

## 2018-04-30 DIAGNOSIS — R109 Unspecified abdominal pain: Secondary | ICD-10-CM | POA: Insufficient documentation

## 2018-04-30 DIAGNOSIS — N939 Abnormal uterine and vaginal bleeding, unspecified: Secondary | ICD-10-CM | POA: Diagnosis not present

## 2018-04-30 LAB — CBC
HEMATOCRIT: 41.8 % (ref 36.0–46.0)
HEMOGLOBIN: 13.6 g/dL (ref 12.0–15.0)
MCH: 29.6 pg (ref 26.0–34.0)
MCHC: 32.5 g/dL (ref 30.0–36.0)
MCV: 91.1 fL (ref 80.0–100.0)
Platelets: 207 10*3/uL (ref 150–400)
RBC: 4.59 MIL/uL (ref 3.87–5.11)
RDW: 12.2 % (ref 11.5–15.5)
WBC: 7.5 10*3/uL (ref 4.0–10.5)
nRBC: 0 % (ref 0.0–0.2)

## 2018-04-30 LAB — COMPREHENSIVE METABOLIC PANEL
ALBUMIN: 3.8 g/dL (ref 3.5–5.0)
ALK PHOS: 43 U/L (ref 38–126)
ALT: 24 U/L (ref 0–44)
ANION GAP: 7 (ref 5–15)
AST: 37 U/L (ref 15–41)
BILIRUBIN TOTAL: 0.6 mg/dL (ref 0.3–1.2)
BUN: 15 mg/dL (ref 6–20)
CALCIUM: 8.9 mg/dL (ref 8.9–10.3)
CO2: 25 mmol/L (ref 22–32)
Chloride: 108 mmol/L (ref 98–111)
Creatinine, Ser: 0.89 mg/dL (ref 0.44–1.00)
GFR calc non Af Amer: >60 mL/min (ref >60)
Glucose, Bld: 99 mg/dL (ref 70–99)
POTASSIUM: 3.8 mmol/L (ref 3.5–5.1)
Sodium: 140 mmol/L (ref 135–145)
Total Protein: 6.7 g/dL (ref 6.5–8.1)

## 2018-04-30 LAB — PREGNANCY, URINE: Preg Test, Ur: NEGATIVE

## 2018-05-07 ENCOUNTER — Ambulatory Visit (HOSPITAL_BASED_OUTPATIENT_CLINIC_OR_DEPARTMENT_OTHER): Payer: 59 | Admitting: Anesthesiology

## 2018-05-07 ENCOUNTER — Encounter (HOSPITAL_COMMUNITY)
Admission: RE | Disposition: A | Discharge status: Home / Self Care | Payer: Self-pay | Source: Ambulatory Visit | Attending: Obstetrics and Gynecology

## 2018-05-07 ENCOUNTER — Encounter (HOSPITAL_COMMUNITY): Payer: Self-pay

## 2018-05-07 ENCOUNTER — Observation Stay (HOSPITAL_BASED_OUTPATIENT_CLINIC_OR_DEPARTMENT_OTHER)
Admission: RE | Admit: 2018-05-07 | Discharge: 2018-05-08 | Disposition: A | Discharge status: Home / Self Care | Payer: 59 | Source: Ambulatory Visit | Attending: Obstetrics and Gynecology | Admitting: Obstetrics and Gynecology

## 2018-05-07 DIAGNOSIS — N838 Other noninflammatory disorders of ovary, fallopian tube and broad ligament: Secondary | ICD-10-CM | POA: Insufficient documentation

## 2018-05-07 DIAGNOSIS — Z8249 Family history of ischemic heart disease and other diseases of the circulatory system: Secondary | ICD-10-CM | POA: Insufficient documentation

## 2018-05-07 DIAGNOSIS — G43909 Migraine, unspecified, not intractable, without status migrainosus: Secondary | ICD-10-CM | POA: Diagnosis not present

## 2018-05-07 DIAGNOSIS — D259 Leiomyoma of uterus, unspecified: Principal | ICD-10-CM | POA: Insufficient documentation

## 2018-05-07 DIAGNOSIS — E669 Obesity, unspecified: Secondary | ICD-10-CM | POA: Diagnosis not present

## 2018-05-07 DIAGNOSIS — Z842 Family history of other diseases of the genitourinary system: Secondary | ICD-10-CM | POA: Diagnosis not present

## 2018-05-07 DIAGNOSIS — Z9049 Acquired absence of other specified parts of digestive tract: Secondary | ICD-10-CM | POA: Diagnosis not present

## 2018-05-07 DIAGNOSIS — Z6837 Body mass index (BMI) 37.0-37.9, adult: Secondary | ICD-10-CM | POA: Diagnosis not present

## 2018-05-07 DIAGNOSIS — Z79899 Other long term (current) drug therapy: Secondary | ICD-10-CM | POA: Insufficient documentation

## 2018-05-07 DIAGNOSIS — N946 Dysmenorrhea, unspecified: Secondary | ICD-10-CM | POA: Diagnosis not present

## 2018-05-07 DIAGNOSIS — N939 Abnormal uterine and vaginal bleeding, unspecified: Secondary | ICD-10-CM | POA: Diagnosis not present

## 2018-05-07 DIAGNOSIS — N736 Female pelvic peritoneal adhesions (postinfective): Secondary | ICD-10-CM | POA: Insufficient documentation

## 2018-05-07 DIAGNOSIS — N938 Other specified abnormal uterine and vaginal bleeding: Secondary | ICD-10-CM | POA: Insufficient documentation

## 2018-05-07 DIAGNOSIS — Z30432 Encounter for removal of intrauterine contraceptive device: Secondary | ICD-10-CM | POA: Diagnosis not present

## 2018-05-07 DIAGNOSIS — Z90710 Acquired absence of both cervix and uterus: Secondary | ICD-10-CM | POA: Diagnosis present

## 2018-05-07 HISTORY — PX: CYSTOSCOPY: SHX5120

## 2018-05-07 HISTORY — PX: TOTAL LAPAROSCOPIC HYSTERECTOMY WITH SALPINGECTOMY: SHX6742

## 2018-05-07 LAB — CBC
HCT: 43.6 % (ref 36.0–46.0)
Hemoglobin: 14.1 g/dL (ref 12.0–15.0)
MCH: 29.9 pg (ref 26.0–34.0)
MCHC: 32.3 g/dL (ref 30.0–36.0)
MCV: 92.4 fL (ref 80.0–100.0)
Platelets: 194 10*3/uL (ref 150–400)
RBC: 4.72 MIL/uL (ref 3.87–5.11)
RDW: 12 % (ref 11.5–15.5)
WBC: 11.8 10*3/uL — ABNORMAL HIGH (ref 4.0–10.5)
nRBC: 0 % (ref 0.0–0.2)

## 2018-05-07 SURGERY — HYSTERECTOMY, TOTAL, LAPAROSCOPIC, WITH SALPINGECTOMY
Anesthesia: General

## 2018-05-07 MED ORDER — GLYCOPYRROLATE 0.2 MG/ML IJ SOLN
INTRAMUSCULAR | Status: DC | PRN
  Administered 2018-05-07: 0.2 mg via INTRAVENOUS

## 2018-05-07 MED ORDER — FENTANYL CITRATE (PF) 100 MCG/2ML IJ SOLN
INTRAMUSCULAR | Status: AC
  Filled 2018-05-07: qty 2

## 2018-05-07 MED ORDER — LACTATED RINGERS IV SOLN: INTRAVENOUS | Status: DC

## 2018-05-07 MED ORDER — KETOROLAC TROMETHAMINE 30 MG/ML IJ SOLN
INTRAMUSCULAR | Status: AC
  Filled 2018-05-07: qty 1

## 2018-05-07 MED ORDER — ROCURONIUM BROMIDE 10 MG/ML (PF) SYRINGE
PREFILLED_SYRINGE | INTRAVENOUS | Status: AC
  Filled 2018-05-07: qty 10

## 2018-05-07 MED ORDER — HYDROMORPHONE HCL 1 MG/ML IJ SOLN
INTRAMUSCULAR | Status: AC
  Filled 2018-05-07: qty 1

## 2018-05-07 MED ORDER — KETOROLAC TROMETHAMINE 30 MG/ML IJ SOLN
INTRAMUSCULAR | Status: DC | PRN
  Administered 2018-05-07: 30 mg via INTRAVENOUS

## 2018-05-07 MED ORDER — LIDOCAINE HCL (CARDIAC) PF 100 MG/5ML IV SOSY
PREFILLED_SYRINGE | INTRAVENOUS | Status: DC | PRN
  Administered 2018-05-07: 60 mg via INTRAVENOUS

## 2018-05-07 MED ORDER — ROCURONIUM BROMIDE 100 MG/10ML IV SOLN
INTRAVENOUS | Status: DC | PRN
  Administered 2018-05-07: 50 mg via INTRAVENOUS
  Administered 2018-05-07: 20 mg via INTRAVENOUS
  Administered 2018-05-07: 5 mg via INTRAVENOUS

## 2018-05-07 MED ORDER — PROPOFOL 10 MG/ML IV BOLUS
INTRAVENOUS | Status: AC
  Filled 2018-05-07: qty 40

## 2018-05-07 MED ORDER — BUPIVACAINE HCL (PF) 0.25 % IJ SOLN
INTRAMUSCULAR | Status: DC | PRN
  Administered 2018-05-07: 14 mL

## 2018-05-07 MED ORDER — SODIUM CHLORIDE 0.9 % IV SOLN
INTRAVENOUS | Status: DC | PRN
  Administered 2018-05-07: 60 mL

## 2018-05-07 MED ORDER — HYDROMORPHONE HCL 1 MG/ML IJ SOLN
0.2000 mg | INTRAMUSCULAR | Status: DC | PRN
  Filled 2018-05-07: qty 1

## 2018-05-07 MED ORDER — ACETAMINOPHEN 325 MG PO TABS: 650.0000 mg | ORAL_TABLET | ORAL | Status: DC | PRN

## 2018-05-07 MED ORDER — ALUM & MAG HYDROXIDE-SIMETH 200-200-20 MG/5ML PO SUSP: 30.0000 mL | ORAL | Status: DC | PRN

## 2018-05-07 MED ORDER — DEXAMETHASONE SODIUM PHOSPHATE 10 MG/ML IJ SOLN
INTRAMUSCULAR | Status: DC | PRN
  Administered 2018-05-07: 5 mg via INTRAVENOUS

## 2018-05-07 MED ORDER — KETOROLAC TROMETHAMINE 30 MG/ML IJ SOLN: 30.0000 mg | Freq: Four times a day (QID) | INTRAMUSCULAR | Status: DC

## 2018-05-07 MED ORDER — MENTHOL 3 MG MT LOZG: 1.0000 | LOZENGE | OROMUCOSAL | Status: DC | PRN

## 2018-05-07 MED ORDER — ENOXAPARIN SODIUM 40 MG/0.4ML ~~LOC~~ SOLN
40.0000 mg | SUBCUTANEOUS | Status: AC
  Administered 2018-05-07: 40 mg via SUBCUTANEOUS
  Filled 2018-05-07: qty 0.4

## 2018-05-07 MED ORDER — DEXAMETHASONE SODIUM PHOSPHATE 10 MG/ML IJ SOLN
INTRAMUSCULAR | Status: AC
  Filled 2018-05-07: qty 1

## 2018-05-07 MED ORDER — KCL IN DEXTROSE-NACL 20-5-0.45 MEQ/L-%-% IV SOLN
INTRAVENOUS | Status: DC
  Administered 2018-05-07: 14:00:00 via INTRAVENOUS
  Filled 2018-05-07 (×2): qty 1000

## 2018-05-07 MED ORDER — MEPERIDINE HCL 50 MG/ML IJ SOLN: 6.2500 mg | INTRAMUSCULAR | Status: DC | PRN

## 2018-05-07 MED ORDER — SODIUM CHLORIDE (PF) 0.9 % IJ SOLN
INTRAMUSCULAR | Status: AC
  Filled 2018-05-07: qty 50

## 2018-05-07 MED ORDER — ZOLPIDEM TARTRATE 5 MG PO TABS: 5.0000 mg | ORAL_TABLET | Freq: Every evening | ORAL | Status: DC | PRN

## 2018-05-07 MED ORDER — MIDAZOLAM HCL 2 MG/2ML IJ SOLN
INTRAMUSCULAR | Status: AC
  Filled 2018-05-07: qty 2

## 2018-05-07 MED ORDER — SUGAMMADEX SODIUM 200 MG/2ML IV SOLN
INTRAVENOUS | Status: DC | PRN
  Administered 2018-05-07: 200 mg via INTRAVENOUS

## 2018-05-07 MED ORDER — PROPOFOL 10 MG/ML IV BOLUS
INTRAVENOUS | Status: DC | PRN
  Administered 2018-05-07: 160 mg via INTRAVENOUS

## 2018-05-07 MED ORDER — FENTANYL CITRATE (PF) 100 MCG/2ML IJ SOLN
INTRAMUSCULAR | Status: DC | PRN
  Administered 2018-05-07: 100 ug via INTRAVENOUS
  Administered 2018-05-07 (×3): 50 ug via INTRAVENOUS
  Administered 2018-05-07: 100 ug via INTRAVENOUS

## 2018-05-07 MED ORDER — HYDROMORPHONE HCL 1 MG/ML IJ SOLN
0.2500 mg | INTRAMUSCULAR | Status: DC | PRN
  Administered 2018-05-07 (×4): 0.5 mg via INTRAVENOUS

## 2018-05-07 MED ORDER — LACTATED RINGERS IV SOLN
INTRAVENOUS | Status: DC
  Administered 2018-05-07 (×2): via INTRAVENOUS

## 2018-05-07 MED ORDER — MIDAZOLAM HCL 5 MG/5ML IJ SOLN
INTRAMUSCULAR | Status: DC | PRN
  Administered 2018-05-07: 2 mg via INTRAVENOUS

## 2018-05-07 MED ORDER — CEFAZOLIN SODIUM-DEXTROSE 2-4 GM/100ML-% IV SOLN
2.0000 g | INTRAVENOUS | Status: AC
  Administered 2018-05-07: 2 g via INTRAVENOUS
  Filled 2018-05-07: qty 100

## 2018-05-07 MED ORDER — OXYCODONE-ACETAMINOPHEN 5-325 MG PO TABS
2.0000 | ORAL_TABLET | ORAL | Status: DC | PRN
  Administered 2018-05-07: 2 via ORAL
  Filled 2018-05-07: qty 2

## 2018-05-07 MED ORDER — SUGAMMADEX SODIUM 200 MG/2ML IV SOLN
INTRAVENOUS | Status: AC
  Filled 2018-05-07: qty 2

## 2018-05-07 MED ORDER — ONDANSETRON HCL 4 MG/2ML IJ SOLN
INTRAMUSCULAR | Status: AC
  Filled 2018-05-07: qty 2

## 2018-05-07 MED ORDER — ENOXAPARIN SODIUM 40 MG/0.4ML ~~LOC~~ SOLN: 40.0000 mg | SUBCUTANEOUS | Status: DC

## 2018-05-07 MED ORDER — ROPIVACAINE HCL 5 MG/ML IJ SOLN
INTRAMUSCULAR | Status: AC
  Filled 2018-05-07: qty 30

## 2018-05-07 MED ORDER — ONDANSETRON HCL 4 MG/2ML IJ SOLN
INTRAMUSCULAR | Status: DC | PRN
  Administered 2018-05-07: 4 mg via INTRAVENOUS

## 2018-05-07 MED ORDER — KETOROLAC TROMETHAMINE 30 MG/ML IJ SOLN
30.0000 mg | Freq: Four times a day (QID) | INTRAMUSCULAR | Status: DC
  Administered 2018-05-07 – 2018-05-08 (×4): 30 mg via INTRAVENOUS
  Filled 2018-05-07 (×4): qty 1

## 2018-05-07 MED ORDER — DOCUSATE SODIUM 100 MG PO CAPS
100.0000 mg | ORAL_CAPSULE | Freq: Two times a day (BID) | ORAL | Status: DC
  Administered 2018-05-07: 100 mg via ORAL
  Filled 2018-05-07: qty 1

## 2018-05-07 MED ORDER — BUPIVACAINE HCL (PF) 0.25 % IJ SOLN
INTRAMUSCULAR | Status: AC
  Filled 2018-05-07: qty 30

## 2018-05-07 MED ORDER — SODIUM CHLORIDE (PF) 0.9 % IJ SOLN
INTRAMUSCULAR | Status: DC | PRN
  Administered 2018-05-07: 10 mL

## 2018-05-07 MED ORDER — FENTANYL CITRATE (PF) 250 MCG/5ML IJ SOLN
INTRAMUSCULAR | Status: AC
  Filled 2018-05-07: qty 5

## 2018-05-07 MED ORDER — SODIUM CHLORIDE 0.9 % IR SOLN
Status: DC | PRN
  Administered 2018-05-07: 1000 mL

## 2018-05-07 MED ORDER — LIP MEDEX EX OINT
TOPICAL_OINTMENT | CUTANEOUS | Status: AC
  Filled 2018-05-07: qty 7

## 2018-05-07 MED ORDER — ONDANSETRON HCL 4 MG PO TABS: 4.0000 mg | ORAL_TABLET | Freq: Four times a day (QID) | ORAL | Status: DC | PRN

## 2018-05-07 MED ORDER — ONDANSETRON HCL 4 MG/2ML IJ SOLN
4.0000 mg | Freq: Four times a day (QID) | INTRAMUSCULAR | Status: DC | PRN
  Administered 2018-05-07: 4 mg via INTRAVENOUS
  Filled 2018-05-07: qty 2

## 2018-05-07 MED ORDER — LIDOCAINE 2% (20 MG/ML) 5 ML SYRINGE
INTRAMUSCULAR | Status: AC
  Filled 2018-05-07: qty 5

## 2018-05-07 MED ORDER — TRAMADOL HCL 50 MG PO TABS
50.0000 mg | ORAL_TABLET | Freq: Four times a day (QID) | ORAL | Status: DC | PRN
  Administered 2018-05-07: 50 mg via ORAL
  Filled 2018-05-07: qty 1

## 2018-05-07 MED ORDER — PROMETHAZINE HCL 25 MG/ML IJ SOLN: 6.2500 mg | INTRAMUSCULAR | Status: DC | PRN

## 2018-05-07 SURGICAL SUPPLY — 49 items
APPLICATOR ARISTA FLEXITIP XL (MISCELLANEOUS) IMPLANT
BLADE SURG 10 STRL SS (BLADE) IMPLANT
CABLE HIGH FREQUENCY MONO STRZ (ELECTRODE) ×4 IMPLANT
CANISTER SUCT 3000ML PPV (MISCELLANEOUS) ×4 IMPLANT
COVER MAYO STAND STRL (DRAPES) ×4 IMPLANT
COVER WAND RF STERILE (DRAPES) ×4 IMPLANT
DECANTER SPIKE VIAL GLASS SM (MISCELLANEOUS) ×12 IMPLANT
DERMABOND ADVANCED (GAUZE/BANDAGES/DRESSINGS) ×2
DERMABOND ADVANCED .7 DNX12 (GAUZE/BANDAGES/DRESSINGS) ×2 IMPLANT
DRSG COVADERM PLUS 2X2 (GAUZE/BANDAGES/DRESSINGS) ×12 IMPLANT
DRSG OPSITE POSTOP 3X4 (GAUZE/BANDAGES/DRESSINGS) ×4 IMPLANT
DURAPREP 26ML APPLICATOR (WOUND CARE) ×4 IMPLANT
GAUZE 4X4 16PLY RFD (DISPOSABLE) ×4 IMPLANT
GLOVE BIO SURGEON STRL SZ 6.5 (GLOVE) ×3 IMPLANT
GLOVE BIO SURGEONS STRL SZ 6.5 (GLOVE) ×1
GLOVE BIOGEL PI IND STRL 7.0 (GLOVE) ×8 IMPLANT
GLOVE BIOGEL PI INDICATOR 7.0 (GLOVE) ×8
GOWN STRL REUS W/TWL LRG LVL3 (GOWN DISPOSABLE) ×16 IMPLANT
HARMONIC RUM II 3.5CM SILVER (DISPOSABLE) ×4
HEMOSTAT ARISTA ABSORB 3G PWDR (MISCELLANEOUS) IMPLANT
LIGASURE VESSEL 5MM BLUNT TIP (ELECTROSURGICAL) ×4 IMPLANT
NEEDLE INSUFFLATION 120MM (ENDOMECHANICALS) ×4 IMPLANT
PACK LAPAROSCOPY BASIN (CUSTOM PROCEDURE TRAY) ×4 IMPLANT
PACK TRENDGUARD 450 HYBRID PRO (MISCELLANEOUS) ×2 IMPLANT
POUCH LAPAROSCOPIC INSTRUMENT (MISCELLANEOUS) ×4 IMPLANT
PROTECTOR NERVE ULNAR (MISCELLANEOUS) ×8 IMPLANT
SCALPEL HRMNC RUM II 3.5 SILVR (DISPOSABLE) ×2 IMPLANT
SCISSORS LAP 5X35 DISP (ENDOMECHANICALS) ×4 IMPLANT
SET CYSTO W/LG BORE CLAMP LF (SET/KITS/TRAYS/PACK) ×4 IMPLANT
SET IRRIG TUBING LAPAROSCOPIC (IRRIGATION / IRRIGATOR) ×4 IMPLANT
SET TRI-LUMEN FLTR TB AIRSEAL (TUBING) ×4 IMPLANT
SHEARS HARMONIC ACE PLUS 36CM (ENDOMECHANICALS) ×4 IMPLANT
SUT VIC AB 0 CT1 36 (SUTURE) ×4 IMPLANT
SUT VIC AB 3-0 PS2 18 (SUTURE) ×2
SUT VIC AB 3-0 PS2 18XBRD (SUTURE) ×2 IMPLANT
SUT VICRYL 0 UR6 27IN ABS (SUTURE) ×4 IMPLANT
SUT VICRYL 4-0 PS2 18IN ABS (SUTURE) ×4 IMPLANT
SUT VLOC 180 0 9IN  GS21 (SUTURE) ×2
SUT VLOC 180 0 9IN GS21 (SUTURE) ×2 IMPLANT
SYR 50ML LL SCALE MARK (SYRINGE) ×8 IMPLANT
TIP UTERINE 6.7X10CM GRN DISP (MISCELLANEOUS) ×4 IMPLANT
TOWEL OR 17X24 6PK STRL BLUE (TOWEL DISPOSABLE) ×8 IMPLANT
TRAY FOLEY W/BAG SLVR 14FR (SET/KITS/TRAYS/PACK) ×4 IMPLANT
TRENDGUARD 450 HYBRID PRO PACK (MISCELLANEOUS) ×4
TROCAR ADV FIXATION 5X100MM (TROCAR) ×4 IMPLANT
TROCAR PORT AIRSEAL 5X120 (TROCAR) ×4 IMPLANT
TROCAR XCEL NON BLADE 8MM B8LT (ENDOMECHANICALS) ×4 IMPLANT
TROCAR XCEL NON-BLD 5MMX100MML (ENDOMECHANICALS) ×4 IMPLANT
WARMER LAPAROSCOPE (MISCELLANEOUS) ×4 IMPLANT

## 2018-05-07 NOTE — Transfer of Care (Signed)
Immediate Anesthesia Transfer of Care Note  Patient: Gabrielle Gilbert  Procedure(s) Performed: TOTAL LAPAROSCOPIC HYSTERECTOMY WITH SALPINGECTOMY (Bilateral ) CYSTOSCOPY (N/A )  Patient Location: PACU  Anesthesia Type:General  Level of Consciousness: awake, alert  and oriented  Airway & Oxygen Therapy: Patient Spontanous Breathing and Patient connected to face mask oxygen  Post-op Assessment: Report given to RN and Post -op Vital signs reviewed and stable  Post vital signs: Reviewed and stable  Last Vitals:  Vitals Value Taken Time  BP 86/43 05/07/2018  9:45 AM  Temp    Pulse 74 05/07/2018  9:46 AM  Resp 14 05/07/2018  9:46 AM  SpO2 100 % 05/07/2018  9:46 AM  Vitals shown include unvalidated device data.  Last Pain:  Vitals:   05/07/18 0607  TempSrc:   PainSc: 0-No pain         Complications: No apparent anesthesia complications

## 2018-05-07 NOTE — Op Note (Signed)
Preoperative Diagnosis: Abnormal uterine bleeding, fibroid uterus, dysmenorrhea  Postoperative Diagnosis: Same  Procedure:  IUD removal, Total Laparoscopic Hysterectomy with bilateral salpingectomies and cystoscopy  Surgeon: Dr Sumner Boast  Assistant: Dr Edwinna Areola, an MD assistant was necessary for tissue manipulation, retraction and positioning due to the complexity of the case and hospital policies   Anesthesia: General  EBL: 50 cc  Fluids: 1,200 cc LR  Urine output: 725  Complications: non3  Indications for surgery: The patient is a 42 year old female, who presented with abnormal uterine bleeding and cramping with the kyleena IUD and a fibroid uterus. She isn't a candidate for OCP's. Ultrasound showed several small myomas (none in her cavity), a thin endometrial lining, and her IUD was in place. She had a normal pap in 3/19.  The patient is aware of the risks and complications involved with the surgery and consent was obtained prior to the procedure.  Findings: EUA: normal sized uterus, no adnexal masses. Laparoscopy: fibroid uterus, normal adnexa bilaterally, normal appendix and liver edge.   Procedure: The patient was taken to the operating room with an IV in placed, preoperative antibiotics had been administered. She was placed in the dorsal lithotomy position. General anesthesia was administered. She was prepped and draped in the usual sterile fashion for an abdominal, vaginal surgery. A rumi uterine manipulator was placed, using a # 3.5 cup and a 10 cm extender. A foley catheter was placed.    The umbilicus was everted, injected with 0.25% marcaine and incised with a # 11 blade. 2 towel clips were used to elevated the umbilicus and a veress needle was placed into the abdominal cavity. The abdominal cavity was insufflated with CO2, with normal intraabdominal pressures. After adequate pneumo-insufflation the veress needle was removed and the 5 mm laparoscope was placed into  the abdominal cavity using the opti-view trocar. The patient was placed in trendelenburg and the abdominal pelvic cavity was inspected. 3 more trocars were placed: 1 in each lower quadrant approximately 3 cm medial to and superior to the anterior superior iliac spine and one in the midline approximately 6 cm above the pubic symphysis in the midline. These areas were injected with 0.25% marcaine, incised with a #11 blade and all trocars were inserted with direct visualization with the laparoscope. A # 5 airseal trocar was placed in the RLQ, a 5 mm trocar in the LLQ and a #8 airseal in the midline. The abdominal pelvic cavity was again inspected. A mixture of 30 cc of Robivacaine and 30 cc of NS was place in the pelvic cavity.   The left tube was elevated from the pelvic sidewall, cauterized and cut with the ligasure device. The mesosalpinx was cauterized and cut with the ligasure device.  The tube was separated from the uterus using the ligasure device and removed through the midline trocar. The left round ligament was cauterized and cut with the ligasure device and the anterior and posterior leafs of the broad ligament were taken down with the ligasure device. The harmonic scalpel was then used to take down the bladder flap and skeltonize the vessels. The left uterine vessels were then clamped, cauterized and ligated with the ligasure device. Hemostasis was excellent. The same procedure was repeated on the right.   Using the rumi manipulator the uterus was pushed up in the pelvic cavity and the harmonic scalpel was used to separate the cervix from the vagina using the harmonic energy. The uterus was  removed into the vagina at this  time and was used as a vaginal occluder to maintain pneumoperitoneum. The vaginal cuff was then closed with a 0 V-lock suture. There was a slight amount of oozing on the left side of the vaginal cuff that was cauterized. Hemostasis was excellent. The abdominal pelvic cavity was  irrigated and suctioned dry. Pressure was released and hemostasis remained excellent. Arista was placed over the surgical site.   The abdominal cavity was desufflated and the trocars were removed. The skin was closed with subcuticular stiches of 4-0 vicryl and dermabond was placed over the incisions.  The uterus was removed from the vagina and the vaginal cuff was inspected. It was hemostatic. The foley catheter was removed and cystoscopy was performed using a 70 degree scope. Both ureters expelled urine, no bladder abnormalities were noted. The bladder was allowed to drain and the cystoscope was removed.   The patient's abdomen and perineum were cleansed and she was taken out of the dorsal lithotomy position. Upon awakening she was extubated and taken to the recovery room in stable condition. The sponge and instrument counts were correct.

## 2018-05-07 NOTE — Anesthesia Postprocedure Evaluation (Signed)
Anesthesia Post Note  Patient: Gabrielle Gilbert  Procedure(s) Performed: TOTAL LAPAROSCOPIC HYSTERECTOMY WITH SALPINGECTOMY (Bilateral ) CYSTOSCOPY (N/A )     Patient location during evaluation: PACU Anesthesia Type: General Level of consciousness: awake and alert Pain management: pain level controlled Vital Signs Assessment: post-procedure vital signs reviewed and stable Respiratory status: spontaneous breathing, nonlabored ventilation, respiratory function stable and patient connected to nasal cannula oxygen Cardiovascular status: blood pressure returned to baseline and stable Postop Assessment: no apparent nausea or vomiting Anesthetic complications: no    Last Vitals:  Vitals:   05/07/18 1134 05/07/18 1234  BP: (!) 103/56 107/60  Pulse: 67 70  Resp: 18 18  Temp: 36.5 C 36.4 C  SpO2: 97% 98%    Last Pain:  Vitals:   05/07/18 1234  TempSrc: Oral  PainSc:                  Effie Berkshire

## 2018-05-07 NOTE — Anesthesia Preprocedure Evaluation (Addendum)
Anesthesia Evaluation  Patient identified by MRN, date of birth, ID band Patient awake    Reviewed: Allergy & Precautions, NPO status , Patient's Chart, lab work & pertinent test results  Airway Mallampati: II  TM Distance: >3 FB Neck ROM: Full    Dental  (+) Teeth Intact, Dental Advisory Given   Pulmonary    breath sounds clear to auscultation       Cardiovascular negative cardio ROS   Rhythm:Regular Rate:Normal     Neuro/Psych  Headaches, negative psych ROS   GI/Hepatic negative GI ROS, Neg liver ROS,   Endo/Other  negative endocrine ROS  Renal/GU negative Renal ROS     Musculoskeletal negative musculoskeletal ROS (+)   Abdominal Normal abdominal exam  (+)   Peds  Hematology negative hematology ROS (+)   Anesthesia Other Findings   Reproductive/Obstetrics negative OB ROS                            Lab Results  Component Value Date   WBC 7.5 04/30/2018   HGB 13.6 04/30/2018   HCT 41.8 04/30/2018   MCV 91.1 04/30/2018   PLT 207 04/30/2018   Lab Results  Component Value Date   CREATININE 0.89 04/30/2018   BUN 15 04/30/2018   NA 140 04/30/2018   K 3.8 04/30/2018   CL 108 04/30/2018   CO2 25 04/30/2018   No results found for: INR, PROTIME  EKG: sinus bradycardia.   Anesthesia Physical Anesthesia Plan  ASA: II  Anesthesia Plan: General   Post-op Pain Management:    Induction: Intravenous  PONV Risk Score and Plan: 4 or greater and Ondansetron, Dexamethasone, Midazolam and Scopolamine patch - Pre-op  Airway Management Planned: Oral ETT  Additional Equipment: None  Intra-op Plan:   Post-operative Plan: Extubation in OR  Informed Consent: I have reviewed the patients History and Physical, chart, labs and discussed the procedure including the risks, benefits and alternatives for the proposed anesthesia with the patient or authorized representative who has indicated  his/her understanding and acceptance.   Dental advisory given  Plan Discussed with: CRNA  Anesthesia Plan Comments:        Anesthesia Quick Evaluation

## 2018-05-07 NOTE — Anesthesia Procedure Notes (Signed)
Procedure Name: Intubation Date/Time: 05/07/2018 7:46 AM Performed by: Jonna Munro, CRNA Pre-anesthesia Checklist: Patient identified, Emergency Drugs available, Suction available, Patient being monitored and Timeout performed Patient Re-evaluated:Patient Re-evaluated prior to induction Oxygen Delivery Method: Circle system utilized Preoxygenation: Pre-oxygenation with 100% oxygen Induction Type: IV induction Ventilation: Mask ventilation without difficulty Laryngoscope Size: Mac and 3 Grade View: Grade I Tube size: 7.0 mm Number of attempts: 1 Airway Equipment and Method: Stylet Placement Confirmation: ETT inserted through vocal cords under direct vision,  positive ETCO2 and breath sounds checked- equal and bilateral Secured at: 21 cm Tube secured with: Tape Dental Injury: Teeth and Oropharynx as per pre-operative assessment

## 2018-05-07 NOTE — OR Nursing (Signed)
IUD REMOVED BY DR. Talbert Nan AT BEGINNING OF CASE.

## 2018-05-07 NOTE — Progress Notes (Signed)
Day of Surgery Procedure(s) (LRB): TOTAL LAPAROSCOPIC HYSTERECTOMY WITH SALPINGECTOMY (Bilateral) CYSTOSCOPY (N/A)  Subjective: Patient reports pain up to a 7/10, hasn't taken any pain medication since got to the floor. She has ambulated and voided, some nausea, currently better, no emesis. Tolerating liquids  Objective: I have reviewed patient's vital signs, intake and output and labs.  Today's Vitals   05/07/18 1115 05/07/18 1134 05/07/18 1234 05/07/18 1356  BP: 106/66 (!) 103/56 107/60 108/67  Pulse: 80 67 70 66  Resp: (!) 22 18 18 18   Temp: (!) 97.4 F (36.3 C) 97.7 F (36.5 C) 97.6 F (36.4 C) 98.3 F (36.8 C)  TempSrc:  Oral Oral Oral  SpO2: 98% 97% 98% 97%  Weight:      Height:      PainSc: 6  7      No intake/output data recorded. Total I/O In: 1559.3 [I.V.:1559.3] Out: 425 [Urine:375; Blood:50]   Lab Results  Component Value Date   WBC 11.8 (H) 05/07/2018   HGB 14.1 05/07/2018   HCT 43.6 05/07/2018   MCV 92.4 05/07/2018   PLT 194 05/07/2018     General: alert, cooperative and no distress Resp: clear to auscultation bilaterally Cardio: S1, S2 normal GI: soft, not tender, hypoactive BS, mildly distended Extremities: extremities normal, atraumatic, no cyanosis or edema  Incisions: clean, dry and intact without erythema.   Assessment: s/p Procedure(s) with comments: TOTAL LAPAROSCOPIC HYSTERECTOMY WITH SALPINGECTOMY (Bilateral) - extended stay recovery CYSTOSCOPY (N/A): stable and progressing well  Plan: Advance diet Encourage ambulation Advance to PO medication Tordal as scheduled medication, other pain meds prn, advised her to ask for pain medication as needed  LOS: 0 days    Salvadore Dom 05/07/2018, 4:48 PM

## 2018-05-07 NOTE — Interval H&P Note (Signed)
History and Physical Interval Note:  05/07/2018 7:14 AM  Gabrielle Gilbert  has presented today for surgery, with the diagnosis of AUB, fibroid uterus, hx endometriosis  The various methods of treatment have been discussed with the patient and family. After consideration of risks, benefits and other options for treatment, the patient has consented to  Procedure(s) with comments: TOTAL LAPAROSCOPIC HYSTERECTOMY WITH SALPINGECTOMY (Bilateral) - extended stay recovery CYSTOSCOPY  possible (N/A) - possible as a surgical intervention .  The patient's history has been reviewed, patient examined, no change in status, stable for surgery.  I have reviewed the patient's chart and labs.  Questions were answered to the patient's satisfaction.     Salvadore Dom

## 2018-05-08 DIAGNOSIS — D259 Leiomyoma of uterus, unspecified: Secondary | ICD-10-CM | POA: Diagnosis not present

## 2018-05-08 MED ORDER — TRAMADOL HCL 50 MG PO TABS: 50.0000 mg | ORAL_TABLET | Freq: Four times a day (QID) | ORAL | 0 refills | Status: DC | PRN

## 2018-05-08 MED ORDER — IBUPROFEN 800 MG PO TABS: 800.0000 mg | ORAL_TABLET | Freq: Three times a day (TID) | ORAL | 0 refills | Status: DC | PRN

## 2018-05-08 MED ORDER — ACETAMINOPHEN 325 MG PO TABS: 650.0000 mg | ORAL_TABLET | ORAL | 1 refills | Status: DC | PRN

## 2018-05-08 MED ORDER — DOCUSATE SODIUM 100 MG PO CAPS: 100.0000 mg | ORAL_CAPSULE | Freq: Two times a day (BID) | ORAL | 0 refills | Status: DC

## 2018-05-08 NOTE — Discharge Summary (Signed)
Physician Discharge Summary  Patient ID: Gabrielle Gilbert MRN: [id number] DOB/AGE: January 31, 1976 42 y.o.  Admit date: 05/07/2018 Discharge date: 05/08/2018  Admission Diagnoses: Abnormal uterine bleeding, fibroid uterus  Discharge Diagnoses:  Active Problems:   Status post laparoscopic hysterectomy   Discharged Condition: good  Hospital Course: Uncomplicated  Consults: None  Significant Diagnostic Studies: labs:  CBC Latest Ref Rng & Units 05/07/2018 04/30/2018 08/16/2017  WBC 4.0 - 10.5 K/uL 11.8(H) 7.5 8.8  Hemoglobin 12.0 - 15.0 g/dL 14.1 13.6 15.2  Hematocrit 36.0 - 46.0 % 43.6 41.8 47.1(H)  Platelets 150 - 400 K/uL 194 207 270   Subjective: doing well, ambulating, tolerating po, pain well controlled, +flatus, voiding well.   Treatments: surgery: Total laparoscopic hysterectomy with bilateral salpingectomies, cystoscopy  Discharge Exam: Blood pressure 125/68, pulse 64, temperature 98.7 F (37.1 C), resp. rate 17, height 5\' 1"  (1.549 m), weight 90.7 kg, last menstrual period 04/27/2018, SpO2 95 %. General appearance: alert, cooperative and no distress Resp: clear to auscultation bilaterally Cardio: S1, S2 normal GI: soft, non-tender; bowel sounds normal; no masses,  no organomegaly and incisions are clean, dry and intact without erythema Extremities: extremities normal, atraumatic, no cyanosis or edema  Disposition: Discharge disposition: 01-Home or Self Care       Discharge Instructions    Call MD for:   Complete by:  As directed    Anything other than light vaginal bleeding   Call MD for:  difficulty breathing, headache or visual disturbances   Complete by:  As directed    Call MD for:  extreme fatigue   Complete by:  As directed    Call MD for:  hives   Complete by:  As directed    Call MD for:  persistant dizziness or light-headedness   Complete by:  As directed    Call MD for:  persistant nausea and vomiting   Complete by:  As directed    Call MD for:   redness, tenderness, or signs of infection (pain, swelling, redness, odor or green/yellow discharge around incision site)   Complete by:  As directed    Call MD for:  severe uncontrolled pain   Complete by:  As directed    Call MD for:  temperature >100.4   Complete by:  As directed    Diet general   Complete by:  As directed    Driving Restrictions   Complete by:  As directed    No driving while taking tramadol or until you can slam on the brakes of the car   Increase activity slowly   Complete by:  As directed    No dressing needed   Complete by:  As directed    Sexual Activity Restrictions   Complete by:  As directed    No intercourse for 8-12 weeks     Allergies as of 05/08/2018   No Known Allergies     Medication List    TAKE these medications   acetaminophen 325 MG tablet Commonly known as:  TYLENOL Take 2 tablets (650 mg total) by mouth every 4 (four) hours as needed for mild pain.   docusate sodium 100 MG capsule Commonly known as:  COLACE Take 1 capsule (100 mg total) by mouth 2 (two) times daily.   ibuprofen 800 MG tablet Commonly known as:  ADVIL,MOTRIN Take 1 tablet (800 mg total) by mouth every 8 (eight) hours as needed.   PROBIOTIC DAILY PO Take by mouth.   traMADol 50 MG tablet Commonly known  as:  ULTRAM Take 1 tablet (50 mg total) by mouth every 6 (six) hours as needed for moderate pain.        Signed: Salvadore Dom 05/08/2018, 7:17 AM

## 2018-05-09 ENCOUNTER — Encounter (HOSPITAL_BASED_OUTPATIENT_CLINIC_OR_DEPARTMENT_OTHER): Payer: Self-pay | Admitting: Obstetrics and Gynecology

## 2018-05-10 NOTE — Progress Notes (Deleted)
GYNECOLOGY  VISIT   HPI: 42 y.o.   Married White or Caucasian Not Hispanic or Latino  female   [phone] with Patient's last menstrual period was 04/27/2018 (exact date).   here for 1 week post op.   GYNECOLOGIC HISTORY: Patient's last menstrual period was 04/27/2018 (exact date). Contraception:*** Menopausal hormone therapy: ***        OB History    Gravida  5   Para  2   Term  2   Preterm  0   AB  3   Living  2     SAB  3   TAB  0   Ectopic  0   Multiple  0   Live Births  1              Patient Active Problem List   Diagnosis Date Noted  . Status post laparoscopic hysterectomy 05/07/2018  . Migraine with aura   . Diverticula of colon 03/01/2017  . Class 2 obesity with body mass index (BMI) of 37.0 to 37.9 in adult 03/01/2017  . Syncope 02/28/2017  . Routine health maintenance 05/01/2012  . Postpartum care following cesarean delivery (6/28) 12/02/2011    Past Medical History:  Diagnosis Date  . Abnormal Pap smear   . Dysmenorrhea   . Endometriosis   . Fibroid   . Heart murmur   . History of fainting spells of unknown cause   . Hormone disorder   . Infertility, female   . Migraine with aura     Past Surgical History:  Procedure Laterality Date  . CERVICAL BIOPSY  W/ LOOP ELECTRODE EXCISION    . CESAREAN SECTION  12/02/2011   Procedure: CESAREAN SECTION;  Surgeon: Marvene Staff, MD;  Location: [facility];  Service: Gynecology;  Laterality: N/A;  . CHOLECYSTECTOMY    . CYSTOSCOPY N/A 05/07/2018   Procedure: CYSTOSCOPY;  Surgeon: Salvadore Dom, MD;  Location: [facility];  Service: Gynecology;  Laterality: N/A;  . DILATION AND CURETTAGE OF UTERUS    . HYSTEROSCOPY    . LAPAROSCOPIC OVARIAN CYSTECTOMY    . LAPAROSCOPY    . LEEP    . MYOMECTOMY     robotic myomectomy, Dr Kerin Perna   . TOTAL LAPAROSCOPIC HYSTERECTOMY WITH SALPINGECTOMY Bilateral 05/07/2018   Procedure: TOTAL LAPAROSCOPIC HYSTERECTOMY WITH SALPINGECTOMY;   Surgeon: Salvadore Dom, MD;  Location: [facility];  Service: Gynecology;  Laterality: Bilateral;  extended stay recovery  . WISDOM TOOTH EXTRACTION      Current Outpatient Medications  Medication Sig Dispense Refill  . acetaminophen (TYLENOL) 325 MG tablet Take 2 tablets (650 mg total) by mouth every 4 (four) hours as needed for mild pain. 30 tablet 1  . docusate sodium (COLACE) 100 MG capsule Take 1 capsule (100 mg total) by mouth 2 (two) times daily. 30 capsule 0  . ibuprofen (ADVIL,MOTRIN) 800 MG tablet Take 1 tablet (800 mg total) by mouth every 8 (eight) hours as needed. 30 tablet 0  . Probiotic Product (PROBIOTIC DAILY PO) Take by mouth.    . traMADol (ULTRAM) 50 MG tablet Take 1 tablet (50 mg total) by mouth every 6 (six) hours as needed for moderate pain. 20 tablet 0   No current facility-administered medications for this visit.      ALLERGIES: Patient has no known allergies.  Family History  Problem Relation Age of Onset  . Hyperlipidemia Mother   . Heart disease Mother   . Hypertension Mother   .  Fibroids Mother   . Hyperlipidemia Father   . Heart disease Father   . Hypertension Father   . Heart disease Sister   . Anesthesia problems Neg Hx     Social History   Socioeconomic History  . Marital status: Married    Spouse name: Not on file  . Number of children: Not on file  . Years of education: Not on file  . Highest education level: Not on file  Occupational History  . Not on file  Social Needs  . Financial resource strain: Not on file  . Food insecurity:    Worry: Not on file    Inability: Not on file  . Transportation needs:    Medical: Not on file    Non-medical: Not on file  Tobacco Use  . Smoking status: Never Smoker  . Smokeless tobacco: Never Used  Substance and Sexual Activity  . Alcohol use: No  . Drug use: No  . Sexual activity: Yes    Birth control/protection: IUD  Lifestyle  . Physical activity:    Days per week:  Not on file    Minutes per session: Not on file  . Stress: Not on file  Relationships  . Social connections:    Talks on phone: Not on file    Gets together: Not on file    Attends religious service: Not on file    Active member of club or organization: Not on file    Attends meetings of clubs or organizations: Not on file    Relationship status: Not on file  . Intimate partner violence:    Fear of current or ex partner: Not on file    Emotionally abused: Not on file    Physically abused: Not on file    Forced sexual activity: Not on file  Other Topics Concern  . Not on file  Social History Narrative  . Not on file    ROS  PHYSICAL EXAMINATION:    LMP 04/27/2018 (Exact Date)     General appearance: alert, cooperative and appears stated age Neck: no adenopathy, supple, symmetrical, trachea midline and thyroid {CHL AMB PHY EX THYROID NORM [phone]::"normal to inspection and palpation"} Breasts: {Exam; breast:13139::"normal appearance, no masses or tenderness"} Abdomen: soft, non-tender; non distended, no masses,  no organomegaly  Pelvic: External genitalia:  no lesions              Urethra:  normal appearing urethra with no masses, tenderness or lesions              Bartholins and Skenes: normal                 Vagina: normal appearing vagina with normal color and discharge, no lesions              Cervix: {CHL AMB PHY EX CERVIX NORM DEFAULT:[phone]::"no lesions"}              Bimanual Exam:  Uterus:  {CHL AMB PHY EX UTERUS NORM DEFAULT:[phone]::"normal size, contour, position, consistency, mobility, non-tender"}              Adnexa: {CHL AMB PHY EX ADNEXA NO MASS DEFAULT:[phone]::"no mass, fullness, tenderness"}              Rectovaginal: {yes no:[id number]}.  Confirms.              Anus:  normal sphincter tone, no lesions  Chaperone was present for exam.  ASSESSMENT     PLAN  An After Visit Summary was printed and given to the patient.  ***  minutes face to face time of which over 50% was spent in counseling.

## 2018-05-14 ENCOUNTER — Ambulatory Visit (INDEPENDENT_AMBULATORY_CARE_PROVIDER_SITE_OTHER): Payer: 59 | Admitting: Family Medicine

## 2018-05-14 ENCOUNTER — Encounter: Payer: Self-pay | Admitting: Family Medicine

## 2018-05-14 ENCOUNTER — Ambulatory Visit: Payer: 59 | Admitting: Obstetrics and Gynecology

## 2018-05-14 ENCOUNTER — Telehealth: Payer: Self-pay | Admitting: Obstetrics and Gynecology

## 2018-05-14 VITALS — BP 100/82 | HR 56 | Temp 98.4°F | Resp 12 | Ht 60.0 in | Wt 198.4 lb

## 2018-05-14 DIAGNOSIS — J040 Acute laryngitis: Secondary | ICD-10-CM | POA: Diagnosis not present

## 2018-05-14 DIAGNOSIS — J020 Streptococcal pharyngitis: Secondary | ICD-10-CM

## 2018-05-14 DIAGNOSIS — J069 Acute upper respiratory infection, unspecified: Secondary | ICD-10-CM

## 2018-05-14 DIAGNOSIS — Z0289 Encounter for other administrative examinations: Secondary | ICD-10-CM

## 2018-05-14 LAB — POCT RAPID STREP A (OFFICE): RAPID STREP A SCREEN: POSITIVE — AB

## 2018-05-14 MED ORDER — FLUTICASONE PROPIONATE 50 MCG/ACT NA SUSP: 1.0000 | Freq: Two times a day (BID) | NASAL | 0 refills | Status: DC

## 2018-05-14 MED ORDER — AMOXICILLIN 500 MG PO CAPS: 500.0000 mg | ORAL_CAPSULE | Freq: Two times a day (BID) | ORAL | 0 refills | Status: AC

## 2018-05-14 NOTE — Progress Notes (Signed)
ACUTE VISIT  HPI:  Chief Complaint  Patient presents with  . Cough    Started Saturday morning  . Sore Throat  . Nasal Congestion  . Hoarse    Gabrielle Gilbert is a 42 y.o.female here today complaining of 2-3 days of respiratory symptoms. Nasal congestion, rhinorrhea, and postnasal drainage. Mild sore throat, "not horrible", exacerbated by coughing. Moderate hoarseness, she denies any stridor or dysphasia.  Productive cough with little greenish sputum, she denies hemoptysis. "Tickle" throat sensation exacerbates cough. Denies chest pain, dyspnea, or wheezing.   Sore Throat   This is a new problem. The current episode started in the past 7 days. The problem has been unchanged. There has been no fever. The pain is mild. Associated symptoms include congestion, coughing, a hoarse voice and a plugged ear sensation. Pertinent negatives include no abdominal pain, diarrhea, ear discharge, ear pain, headaches, neck pain, shortness of breath, stridor, swollen glands, trouble swallowing or vomiting. She has had no exposure to strep or mono. She has tried acetaminophen for the symptoms. The treatment provided mild relief.    Bilateral ear fullness sensation, denies earache or hearing loss.  No Hx of recent travel. Her father-in-law was sick with a cold. Status post hysterectomy, 05/07/2018.  No known insect bite.  Hx of allergies: Negative    Review of Systems  Constitutional: Positive for appetite change and fatigue. Negative for activity change and fever.  HENT: Positive for congestion, hoarse voice, postnasal drip, sore throat and voice change. Negative for ear discharge, ear pain, facial swelling, mouth sores, sinus pressure and trouble swallowing.   Eyes: Negative for discharge and redness.  Respiratory: Positive for cough. Negative for shortness of breath, wheezing and stridor.   Gastrointestinal: Negative for abdominal pain, diarrhea, nausea and vomiting.    Musculoskeletal: Negative for myalgias and neck pain.  Skin: Negative for rash.  Allergic/Immunologic: Negative for environmental allergies.  Neurological: Negative for weakness and headaches.  Hematological: Negative for adenopathy. Does not bruise/bleed easily.      Current Outpatient Medications on File Prior to Visit  Medication Sig Dispense Refill  . acetaminophen (TYLENOL) 325 MG tablet Take 2 tablets (650 mg total) by mouth every 4 (four) hours as needed for mild pain. 30 tablet 1  . docusate sodium (COLACE) 100 MG capsule Take 1 capsule (100 mg total) by mouth 2 (two) times daily. 30 capsule 0  . ibuprofen (ADVIL,MOTRIN) 800 MG tablet Take 1 tablet (800 mg total) by mouth every 8 (eight) hours as needed. 30 tablet 0  . Probiotic Product (PROBIOTIC DAILY PO) Take by mouth.    . traMADol (ULTRAM) 50 MG tablet Take 1 tablet (50 mg total) by mouth every 6 (six) hours as needed for moderate pain. 20 tablet 0   No current facility-administered medications on file prior to visit.      Past Medical History:  Diagnosis Date  . Abnormal Pap smear   . Dysmenorrhea   . Endometriosis   . Fibroid   . Heart murmur   . History of fainting spells of unknown cause   . Hormone disorder   . Infertility, female   . Migraine with aura    No Known Allergies  Social History   Socioeconomic History  . Marital status: Married    Spouse name: Not on file  . Number of children: Not on file  . Years of education: Not on file  . Highest education level: Not on file  Occupational  History  . Not on file  Social Needs  . Financial resource strain: Not on file  . Food insecurity:    Worry: Not on file    Inability: Not on file  . Transportation needs:    Medical: Not on file    Non-medical: Not on file  Tobacco Use  . Smoking status: Never Smoker  . Smokeless tobacco: Never Used  Substance and Sexual Activity  . Alcohol use: No  . Drug use: No  . Sexual activity: Yes    Birth  control/protection: IUD  Lifestyle  . Physical activity:    Days per week: Not on file    Minutes per session: Not on file  . Stress: Not on file  Relationships  . Social connections:    Talks on phone: Not on file    Gets together: Not on file    Attends religious service: Not on file    Active member of club or organization: Not on file    Attends meetings of clubs or organizations: Not on file    Relationship status: Not on file  Other Topics Concern  . Not on file  Social History Narrative  . Not on file    Vitals:   05/14/18 1105  BP: 100/82  Pulse: (!) 56  Resp: 12  Temp: 98.4 F (36.9 C)  SpO2: 97%   Body mass index is 37.49 kg/m.   Physical Exam  Constitutional: She is oriented to person, place, and time. She appears well-developed. She does not appear ill. No distress.  HENT:  Head: Atraumatic.  Right Ear: Tympanic membrane, external ear and ear canal normal.  Left Ear: Tympanic membrane, external ear and ear canal normal.  Nose: Rhinorrhea present. Right sinus exhibits no maxillary sinus tenderness and no frontal sinus tenderness. Left sinus exhibits no maxillary sinus tenderness and no frontal sinus tenderness.  Mouth/Throat: Mucous membranes are normal. Posterior oropharyngeal erythema present. No oropharyngeal exudate or posterior oropharyngeal edema.  Moderate dysphonia. Postnasal drainage. Hypertrophic turbinates.   Eyes: Conjunctivae are normal.  Neck: No muscular tenderness present. No edema and no erythema present.  Cardiovascular: Regular rhythm. Bradycardia present.  No murmur heard. Respiratory: Effort normal and breath sounds normal. No stridor. No respiratory distress.  Lymphadenopathy:       Head (right side): No submandibular adenopathy present.       Head (left side): No submandibular adenopathy present.    She has no cervical adenopathy.  Neurological: She is alert and oriented to person, place, and time. She has normal strength.    Skin: Skin is warm. No rash noted. No erythema.  Psychiatric: She has a normal mood and affect. Her speech is normal.  Well groomed, good eye contact.    ASSESSMENT AND PLAN:  Gabrielle Gilbert was seen today for cough, sore throat, nasal congestion and hoarse.  Diagnoses and all orders for this visit:  Strep sore throat -     POCT rapid strep A  URI, acute Plenty of p.o. fluids. Nasal saline irrigations several times per day. Flonase may help with postnasal drainage and therefore with cough. Explained that congestion and cough can last a few days and even weeks. Follow-up as needed.  -     fluticasone (FLONASE) 50 MCG/ACT nasal spray; Place 1 spray into both nostrils 2 (two) times daily.  Laryngitis, acute Voice rest. Instructed about warning signs.  Strep pharyngitis Rapid strep test done before she was seen was positive for strep. We discussed treatment options, recommend  amoxicillin for 10 days. Gargles with saline and follow-up changes may also help.  -     amoxicillin (AMOXIL) 500 MG capsule; Take 1 capsule (500 mg total) by mouth 2 (two) times daily for 10 days.   We discussed findings on examination, including mild bradycardia.  She exercises regularly and has not noted chest pain dizziness, or palpitation.     Gabrielle Giovanelli G. [place], MD  [facility]. [place] office.

## 2018-05-14 NOTE — Patient Instructions (Addendum)
A few things to remember from today's visit:   Strep sore throat - Plan: POCT rapid strep A  URI, acute  Laryngitis, acute  Strep pharyngitis - Plan: amoxicillin (AMOXIL) 500 MG capsule  Plenty of fluids. Over-the-counter decongestants may help with ear fullness sensation. Because rapid strep here in the office was positive I am treated with antibiotics.   Symptomatic treatment: Over the counter Acetaminophen 500 mg and/or Ibuprofen (400-600 mg) if there is not contraindications; you can alternate in between both every 4-6 hours. Gargles with saline water and throat lozenges might also help. Cold fluids.    Seek prompt medical evaluation if you are having difficulty breathing, mouth swelling, throat closing up, not able to swallow liquids (drooling), skin rash/bruising, or worsening symptoms.  Please follow up in 2 weeks if not any better.     Please be sure medication list is accurate. If a new problem present, please set up appointment sooner than planned today.

## 2018-05-14 NOTE — Telephone Encounter (Signed)
Patient cancelled her 1 week post op appointment for today with Dr. Talbert Nan due to being diagnosed with strep throat this morning. She rescheduled to Friday, 05/18/18, at 10:45am. Patient did not have any complaints related to her surgery.

## 2018-05-17 NOTE — Progress Notes (Signed)
GYNECOLOGY  VISIT   HPI: 42 y.o.   Married White or Caucasian Not Hispanic or Latino  female   [phone] with Patient's last menstrual period was 04/27/2018 (exact date).   here for   1 week post op s/p TLH/BS. Overall doing well, when she increases her activity she has some cramping. She has a URI, the coughing and sneezing increase her discomfort. She is taking ibuprofen helps some. Pain is a 3/10, menstrual cramping type pain. No fever. Normal BM, voiding okay. Slight spotting after walking around for 1.5 hours.   GYNECOLOGIC HISTORY: Patient's last menstrual period was 04/27/2018 (exact date). Contraception: Hysterectomy  Menopausal hormone therapy: none         OB History    Gravida  5   Para  2   Term  2   Preterm  0   AB  3   Living  2     SAB  3   TAB  0   Ectopic  0   Multiple  0   Live Births  1              Patient Active Problem List   Diagnosis Date Noted  . Status post laparoscopic hysterectomy 05/07/2018  . Migraine with aura   . Diverticula of colon 03/01/2017  . Class 2 obesity with body mass index (BMI) of 37.0 to 37.9 in adult 03/01/2017  . Syncope 02/28/2017  . Routine health maintenance 05/01/2012  . Postpartum care following cesarean delivery (6/28) 12/02/2011    Past Medical History:  Diagnosis Date  . Abnormal Pap smear   . Dysmenorrhea   . Endometriosis   . Fibroid   . Heart murmur   . History of fainting spells of unknown cause   . Hormone disorder   . Infertility, female   . Migraine with aura     Past Surgical History:  Procedure Laterality Date  . CERVICAL BIOPSY  W/ LOOP ELECTRODE EXCISION    . CESAREAN SECTION  12/02/2011   Procedure: CESAREAN SECTION;  Surgeon: Marvene Staff, MD;  Location: [facility];  Service: Gynecology;  Laterality: N/A;  . CHOLECYSTECTOMY    . CYSTOSCOPY N/A 05/07/2018   Procedure: CYSTOSCOPY;  Surgeon: Salvadore Dom, MD;  Location: [facility];  Service: Gynecology;   Laterality: N/A;  . DILATION AND CURETTAGE OF UTERUS    . HYSTEROSCOPY    . LAPAROSCOPIC OVARIAN CYSTECTOMY    . LAPAROSCOPY    . LEEP    . MYOMECTOMY     robotic myomectomy, Dr Kerin Perna   . TOTAL LAPAROSCOPIC HYSTERECTOMY WITH SALPINGECTOMY Bilateral 05/07/2018   Procedure: TOTAL LAPAROSCOPIC HYSTERECTOMY WITH SALPINGECTOMY;  Surgeon: Salvadore Dom, MD;  Location: [facility];  Service: Gynecology;  Laterality: Bilateral;  extended stay recovery  . WISDOM TOOTH EXTRACTION      Current Outpatient Medications  Medication Sig Dispense Refill  . acetaminophen (TYLENOL) 325 MG tablet Take 2 tablets (650 mg total) by mouth every 4 (four) hours as needed for mild pain. 30 tablet 1  . amoxicillin (AMOXIL) 500 MG capsule Take 1 capsule (500 mg total) by mouth 2 (two) times daily for 10 days. 20 capsule 0  . docusate sodium (COLACE) 100 MG capsule Take 1 capsule (100 mg total) by mouth 2 (two) times daily. 30 capsule 0  . fluticasone (FLONASE) 50 MCG/ACT nasal spray Place 1 spray into both nostrils 2 (two) times daily. 16 g 0  . ibuprofen (ADVIL,MOTRIN) 800  MG tablet Take 1 tablet (800 mg total) by mouth every 8 (eight) hours as needed. 30 tablet 0  . Probiotic Product (PROBIOTIC DAILY PO) Take by mouth.    . traMADol (ULTRAM) 50 MG tablet Take 1 tablet (50 mg total) by mouth every 6 (six) hours as needed for moderate pain. 20 tablet 0   No current facility-administered medications for this visit.      ALLERGIES: Patient has no known allergies.  Family History  Problem Relation Age of Onset  . Hyperlipidemia Mother   . Heart disease Mother   . Hypertension Mother   . Fibroids Mother   . Hyperlipidemia Father   . Heart disease Father   . Hypertension Father   . Heart disease Sister   . Anesthesia problems Neg Hx     Social History   Socioeconomic History  . Marital status: Married    Spouse name: Not on file  . Number of children: Not on file  . Years of  education: Not on file  . Highest education level: Not on file  Occupational History  . Not on file  Social Needs  . Financial resource strain: Not on file  . Food insecurity:    Worry: Not on file    Inability: Not on file  . Transportation needs:    Medical: Not on file    Non-medical: Not on file  Tobacco Use  . Smoking status: Never Smoker  . Smokeless tobacco: Never Used  Substance and Sexual Activity  . Alcohol use: No  . Drug use: No  . Sexual activity: Yes    Birth control/protection: Surgical    Comment: hysterecomy  Lifestyle  . Physical activity:    Days per week: Not on file    Minutes per session: Not on file  . Stress: Not on file  Relationships  . Social connections:    Talks on phone: Not on file    Gets together: Not on file    Attends religious service: Not on file    Active member of club or organization: Not on file    Attends meetings of clubs or organizations: Not on file    Relationship status: Not on file  . Intimate partner violence:    Fear of current or ex partner: Not on file    Emotionally abused: Not on file    Physically abused: Not on file    Forced sexual activity: Not on file  Other Topics Concern  . Not on file  Social History Narrative  . Not on file    Review of Systems  Constitutional: Negative.   HENT: Negative.   Eyes: Negative.   Respiratory: Negative.   Cardiovascular: Negative.   Gastrointestinal: Negative.   Genitourinary:       Moderate cramping   Musculoskeletal: Negative.   Skin: Negative.   Neurological: Negative.   Endo/Heme/Allergies: Negative.   Psychiatric/Behavioral: Negative.     PHYSICAL EXAMINATION:    BP 136/68 (BP Location: Right Arm, Patient Position: Sitting, Cuff Size: Large)   Pulse 66   Resp 14   Ht 5' 0.75" (1.543 m)   Wt 197 lb 6.4 oz (89.5 kg)   LMP 04/27/2018 (Exact Date)   BMI 37.61 kg/m     General appearance: alert, cooperative and appears stated age Abdomen: soft, minimally  tender in the LLQ, no rebound, no guarding. No other tenderness; non distended, no masses,  no organomegaly Incisions: healing well, no erythema, no induration, no drainage.  ASSESSMENT 1.5 weeks post op s/p TLH/BS, doing well    PLAN Routine F/U   An After Visit Summary was printed and given to the patient.

## 2018-05-18 ENCOUNTER — Other Ambulatory Visit: Payer: Self-pay

## 2018-05-18 ENCOUNTER — Encounter: Payer: Self-pay | Admitting: Obstetrics and Gynecology

## 2018-05-18 ENCOUNTER — Ambulatory Visit (INDEPENDENT_AMBULATORY_CARE_PROVIDER_SITE_OTHER): Payer: 59 | Admitting: Obstetrics and Gynecology

## 2018-05-18 VITALS — BP 136/68 | HR 66 | Resp 14 | Ht 60.75 in | Wt 197.4 lb

## 2018-05-18 DIAGNOSIS — Z90710 Acquired absence of both cervix and uterus: Secondary | ICD-10-CM

## 2018-06-04 ENCOUNTER — Encounter: Payer: Self-pay | Admitting: Obstetrics and Gynecology

## 2018-06-04 ENCOUNTER — Ambulatory Visit (INDEPENDENT_AMBULATORY_CARE_PROVIDER_SITE_OTHER): Payer: 59 | Admitting: Obstetrics and Gynecology

## 2018-06-04 ENCOUNTER — Other Ambulatory Visit: Payer: Self-pay

## 2018-06-04 VITALS — BP 112/72 | HR 80 | Wt 203.6 lb

## 2018-06-04 DIAGNOSIS — Z90710 Acquired absence of both cervix and uterus: Secondary | ICD-10-CM

## 2018-06-04 NOTE — Progress Notes (Signed)
GYNECOLOGY  VISIT   HPI: 42 y.o.   Married White or Caucasian Not Hispanic or Latino  female   [phone] with Patient's last menstrual period was 04/27/2018 (exact date).   here for 4 week post op s/p TLH/BS. Benign pathology. She is doing well, normal BM and bladder function. She was walking fast the other day and feels she pulled a muscle in her right lower abdomen. Getting better. Occasional spotting.   GYNECOLOGIC HISTORY: Patient's last menstrual period was 04/27/2018 (exact date). Contraception: Hysterectomy Menopausal hormone therapy: None       OB History    Gravida  5   Para  2   Term  2   Preterm  0   AB  3   Living  2     SAB  3   TAB  0   Ectopic  0   Multiple  0   Live Births  1              Patient Active Problem List   Diagnosis Date Noted  . Status post laparoscopic hysterectomy 05/07/2018  . Migraine with aura   . Diverticula of colon 03/01/2017  . Class 2 obesity with body mass index (BMI) of 37.0 to 37.9 in adult 03/01/2017  . Syncope 02/28/2017  . Routine health maintenance 05/01/2012  . Postpartum care following cesarean delivery (6/28) 12/02/2011    Past Medical History:  Diagnosis Date  . Abnormal Pap smear   . Dysmenorrhea   . Endometriosis   . Fibroid   . Heart murmur   . History of fainting spells of unknown cause   . Hormone disorder   . Infertility, female   . Migraine with aura     Past Surgical History:  Procedure Laterality Date  . CERVICAL BIOPSY  W/ LOOP ELECTRODE EXCISION    . CESAREAN SECTION  12/02/2011   Procedure: CESAREAN SECTION;  Surgeon: Marvene Staff, MD;  Location: [facility];  Service: Gynecology;  Laterality: N/A;  . CHOLECYSTECTOMY    . CYSTOSCOPY N/A 05/07/2018   Procedure: CYSTOSCOPY;  Surgeon: Salvadore Dom, MD;  Location: [facility];  Service: Gynecology;  Laterality: N/A;  . DILATION AND CURETTAGE OF UTERUS    . HYSTEROSCOPY    . LAPAROSCOPIC OVARIAN CYSTECTOMY    .  LAPAROSCOPY    . LEEP    . MYOMECTOMY     robotic myomectomy, Dr Kerin Perna   . TOTAL LAPAROSCOPIC HYSTERECTOMY WITH SALPINGECTOMY Bilateral 05/07/2018   Procedure: TOTAL LAPAROSCOPIC HYSTERECTOMY WITH SALPINGECTOMY;  Surgeon: Salvadore Dom, MD;  Location: [facility];  Service: Gynecology;  Laterality: Bilateral;  extended stay recovery  . WISDOM TOOTH EXTRACTION      Current Outpatient Medications  Medication Sig Dispense Refill  . acetaminophen (TYLENOL) 325 MG tablet Take 2 tablets (650 mg total) by mouth every 4 (four) hours as needed for mild pain. 30 tablet 1  . fluticasone (FLONASE) 50 MCG/ACT nasal spray Place 1 spray into both nostrils 2 (two) times daily. 16 g 0  . ibuprofen (ADVIL,MOTRIN) 800 MG tablet Take 1 tablet (800 mg total) by mouth every 8 (eight) hours as needed. 30 tablet 0  . Probiotic Product (PROBIOTIC DAILY PO) Take by mouth.    . docusate sodium (COLACE) 100 MG capsule Take 1 capsule (100 mg total) by mouth 2 (two) times daily. (Patient not taking: Reported on 06/04/2018) 30 capsule 0  . traMADol (ULTRAM) 50 MG tablet Take 1 tablet (50  mg total) by mouth every 6 (six) hours as needed for moderate pain. (Patient not taking: Reported on 06/04/2018) 20 tablet 0   No current facility-administered medications for this visit.      ALLERGIES: Patient has no known allergies.  Family History  Problem Relation Age of Onset  . Hyperlipidemia Mother   . Heart disease Mother   . Hypertension Mother   . Fibroids Mother   . Hyperlipidemia Father   . Heart disease Father   . Hypertension Father   . Heart disease Sister   . Anesthesia problems Neg Hx     Social History   Socioeconomic History  . Marital status: Married    Spouse name: Not on file  . Number of children: Not on file  . Years of education: Not on file  . Highest education level: Not on file  Occupational History  . Not on file  Social Needs  . Financial resource strain: Not  on file  . Food insecurity:    Worry: Not on file    Inability: Not on file  . Transportation needs:    Medical: Not on file    Non-medical: Not on file  Tobacco Use  . Smoking status: Never Smoker  . Smokeless tobacco: Never Used  Substance and Sexual Activity  . Alcohol use: No  . Drug use: No  . Sexual activity: Yes    Birth control/protection: Surgical    Comment: hysterecomy  Lifestyle  . Physical activity:    Days per week: Not on file    Minutes per session: Not on file  . Stress: Not on file  Relationships  . Social connections:    Talks on phone: Not on file    Gets together: Not on file    Attends religious service: Not on file    Active member of club or organization: Not on file    Attends meetings of clubs or organizations: Not on file    Relationship status: Not on file  . Intimate partner violence:    Fear of current or ex partner: Not on file    Emotionally abused: Not on file    Physically abused: Not on file    Forced sexual activity: Not on file  Other Topics Concern  . Not on file  Social History Narrative  . Not on file    Review of Systems  Constitutional: Negative.   HENT: Negative.   Eyes: Negative.   Respiratory: Negative.   Cardiovascular: Negative.   Gastrointestinal:       Abdominal cramping  Genitourinary: Negative.   Musculoskeletal: Negative.   Skin: Negative.   Neurological: Negative.   Endo/Heme/Allergies: Negative.   Psychiatric/Behavioral: Negative.     PHYSICAL EXAMINATION:    BP 112/72 (BP Location: Right Arm, Patient Position: Sitting, Cuff Size: Normal)   Pulse 80   Wt 203 lb 9.6 oz (92.4 kg)   LMP 04/27/2018 (Exact Date)   BMI 38.79 kg/m     General appearance: alert, cooperative and appears stated age Abdomen: soft, non-tender; non distended, no masses,  no organomegaly Incisions: healing well  Pelvic: External genitalia:  no lesions              Urethra:  normal appearing urethra with no masses, tenderness  or lesions              Bartholins and Skenes: normal                 Vagina: normal appearing  vagina with normal color and discharge, no lesions  Vaginal apex with area of slight friability, treated with silver nitrate. Not tender to palpation, no masses              Cervix: absent              Bimanual Exam:  Uterus:  uterus absent              Adnexa: no mass, fullness, tenderness                Chaperone was present for exam.  ASSESSMENT 1 month s/p TLH/BS, doing well    PLAN Routine f/u Annual in 3/20   An After Visit Summary was printed and given to the patient.

## 2018-06-07 ENCOUNTER — Ambulatory Visit: Payer: 59 | Admitting: Obstetrics and Gynecology

## 2018-08-21 NOTE — Progress Notes (Signed)
43 y.o. [id number] Married White or Caucasian Not Hispanic or Latino female here for annual exam.  She is s/p TLH/BS in 12/19 for AUB, fibroid uterus and dysmenorrhea.  Doing well. Sexually active, no dyspareunia.  She had some bleeding with BM, area was irritated and itchy, better now.  Normal BM. Normal bladder function.  She c/o 10 lb weight gain since a month or so after her surgery. She has been exercising and eating healthy.     Patient's last menstrual period was 04/27/2018 (exact date).          Sexually active: Yes.    The current method of family planning is status post hysterectomy.    Exercising: Yes.    crossfit, running Smoker:  no  Health Maintenance: Pap:  08/16/2017 WNL NEG HPV History of abnormal Pap:  yes, h/o LEEP years ago.  MMG:  Never Colonoscopy:  n/a BMD:   n/a TDaP:  12/03/11  Gardasil: N/A   reports that she has never smoked. She has never used smokeless tobacco. She reports current alcohol use of about 3.0 standard drinks of alcohol per week. She reports that she does not use drugs. Daughter is 39, son is 5. 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  Past Medical History:  Diagnosis Date  . Abnormal Pap smear   . Dysmenorrhea   . Endometriosis   . Fibroid   . Heart murmur   . History of fainting spells of unknown cause   . Hormone disorder   . Infertility, female   . Migraine with aura     Past Surgical History:  Procedure Laterality Date  . CERVICAL BIOPSY  W/ LOOP ELECTRODE EXCISION    . CESAREAN SECTION  12/02/2011   Procedure: CESAREAN SECTION;  Surgeon: Marvene Staff, MD;  Location: [facility];  Service: Gynecology;  Laterality: N/A;  . CHOLECYSTECTOMY    . CYSTOSCOPY N/A 05/07/2018   Procedure: CYSTOSCOPY;  Surgeon: Salvadore Dom, MD;  Location: [facility];  Service: Gynecology;  Laterality: N/A;  . DILATION AND CURETTAGE OF UTERUS    . history of leep     1998  . HYSTEROSCOPY    . LAPAROSCOPIC OVARIAN  CYSTECTOMY    . LAPAROSCOPY    . LEEP    . MYOMECTOMY     robotic myomectomy, Dr Kerin Perna   . TOTAL LAPAROSCOPIC HYSTERECTOMY WITH SALPINGECTOMY Bilateral 05/07/2018   Procedure: TOTAL LAPAROSCOPIC HYSTERECTOMY WITH SALPINGECTOMY;  Surgeon: Salvadore Dom, MD;  Location: [facility];  Service: Gynecology;  Laterality: Bilateral;  extended stay recovery  . WISDOM TOOTH EXTRACTION      Current Outpatient Medications  Medication Sig Dispense Refill  . Ascorbic Acid (VITAMIN C) 100 MG tablet Take 100 mg by mouth daily.    . Probiotic Product (PROBIOTIC DAILY PO) Take by mouth.    . fluticasone (FLONASE) 50 MCG/ACT nasal spray Place 1 spray into both nostrils 2 (two) times daily. 16 g 0   No current facility-administered medications for this visit.     Family History  Problem Relation Age of Onset  . Hyperlipidemia Mother   . Heart disease Mother   . Hypertension Mother   . Fibroids Mother   . Hyperlipidemia Father   . Heart disease Father   . Hypertension Father   . Heart disease Sister   . Anesthesia problems Neg Hx     Review of Systems  Constitutional:       Weight gain  HENT: Negative.   Eyes: Negative.   Respiratory: Negative.   Cardiovascular: Negative.   Gastrointestinal: Negative.   Endocrine: Negative.   Genitourinary: Negative.   Musculoskeletal: Negative.   Skin: Negative.   Allergic/Immunologic: Negative.   Neurological: Negative.   Hematological: Negative.   Psychiatric/Behavioral: Negative.     Exam:   BP 118/82 (BP Location: Right Arm, Patient Position: Sitting, Cuff Size: Large)   Pulse 60   Ht 5\' 2"  (1.575 m)   Wt 204 lb (92.5 kg)   LMP 04/27/2018 (Exact Date)   BMI 37.31 kg/m   Weight change: @WEIGHTCHANGE @ Height:   Height: 5\' 2"  (157.5 cm)  Ht Readings from Last 3 Encounters:  08/23/18 5\' 2"  (1.575 m)  05/18/18 5' 0.75" (1.543 m)  05/14/18 5' (1.524 m)    General appearance: alert, cooperative and appears stated  age Head: Normocephalic, without obvious abnormality, atraumatic Neck: no adenopathy, supple, symmetrical, trachea midline and thyroid normal to inspection and palpation Lungs: clear to auscultation bilaterally Cardiovascular: regular rate and rhythm Breasts: normal appearance, no masses or tenderness Abdomen: soft, non-tender; non distended,  no masses,  no organomegaly Extremities: extremities normal, atraumatic, no cyanosis or edema Skin: Skin color, texture, turgor normal. No rashes or lesions Lymph nodes: Cervical, supraclavicular, and axillary nodes normal. No abnormal inguinal nodes palpated Neurologic: Grossly normal   Pelvic: External genitalia:  no lesions              Urethra:  normal appearing urethra with no masses, tenderness or lesions              Bartholins and Skenes: normal                 Vagina: normal appearing vagina with normal color and discharge, no lesions  Vaginal vault: 1 cm area of granulation tissue, treated with silver nitrate.               Cervix: absent               Bimanual Exam:  Uterus:  uterus absent              Adnexa: no mass, fullness, tenderness               Rectovaginal: Confirms               Anus:  normal sphincter tone, no lesions  Chaperone was present for exam.  A:  Well Woman with normal exam  BMI 37, gaining weight with increased exercise, eating healthy.   H/o TLH in 12/19, doing well. Granulation tissue at the vault treated  Distant h/o leep  P:   Screening labs, TSH, HgbA1C (return for fasting labs)  No pap this year, will check every 3-5 years  Mammogram due, # given  Discussed breast self exam  Discussed calcium and vit D intake

## 2018-08-23 ENCOUNTER — Encounter: Payer: Self-pay | Admitting: Obstetrics and Gynecology

## 2018-08-23 ENCOUNTER — Other Ambulatory Visit: Payer: Self-pay

## 2018-08-23 ENCOUNTER — Ambulatory Visit (INDEPENDENT_AMBULATORY_CARE_PROVIDER_SITE_OTHER): Payer: 59 | Admitting: Obstetrics and Gynecology

## 2018-08-23 VITALS — BP 118/82 | HR 60 | Ht 62.0 in | Wt 204.0 lb

## 2018-08-23 DIAGNOSIS — Z0000 Encounter for general adult medical examination without abnormal findings: Secondary | ICD-10-CM | POA: Diagnosis not present

## 2018-08-23 DIAGNOSIS — R635 Abnormal weight gain: Secondary | ICD-10-CM

## 2018-08-23 DIAGNOSIS — Z01419 Encounter for gynecological examination (general) (routine) without abnormal findings: Secondary | ICD-10-CM

## 2018-08-23 DIAGNOSIS — Z6837 Body mass index (BMI) 37.0-37.9, adult: Secondary | ICD-10-CM | POA: Diagnosis not present

## 2018-08-23 DIAGNOSIS — N898 Other specified noninflammatory disorders of vagina: Secondary | ICD-10-CM

## 2018-08-23 NOTE — Patient Instructions (Signed)

## 2018-08-26 ENCOUNTER — Telehealth: Payer: Self-pay | Admitting: *Deleted

## 2018-08-26 NOTE — Telephone Encounter (Signed)
Call to patient. Left message that due to [facility], Routine appointments have been canceled.  Advised office is available of emergency care. Can call office tomorrow after 9 am.  Routing to [employer]- recall- annual labs.

## 2018-08-27 ENCOUNTER — Other Ambulatory Visit: Payer: 59

## 2018-11-19 ENCOUNTER — Other Ambulatory Visit: Payer: Self-pay

## 2018-11-19 ENCOUNTER — Ambulatory Visit (INDEPENDENT_AMBULATORY_CARE_PROVIDER_SITE_OTHER): Payer: 59 | Admitting: Family Medicine

## 2018-11-19 ENCOUNTER — Encounter: Payer: Self-pay | Admitting: Family Medicine

## 2018-11-19 VITALS — BP 124/82 | HR 60 | Temp 97.6°F | Resp 12 | Ht 60.0 in

## 2018-11-19 DIAGNOSIS — R1032 Left lower quadrant pain: Secondary | ICD-10-CM

## 2018-11-19 DIAGNOSIS — R197 Diarrhea, unspecified: Secondary | ICD-10-CM | POA: Diagnosis not present

## 2018-11-19 DIAGNOSIS — R109 Unspecified abdominal pain: Secondary | ICD-10-CM | POA: Diagnosis not present

## 2018-11-19 LAB — CBC WITH DIFFERENTIAL/PLATELET
Basophils Absolute: 0 10*3/uL (ref 0.0–0.1)
Basophils Relative: 0.2 % (ref 0.0–3.0)
Eosinophils Absolute: 0.1 10*3/uL (ref 0.0–0.7)
Eosinophils Relative: 1.1 % (ref 0.0–5.0)
HCT: 42.9 % (ref 36.0–46.0)
Hemoglobin: 14.5 g/dL (ref 12.0–15.0)
Lymphocytes Relative: 19.1 % (ref 12.0–46.0)
Lymphs Abs: 2.3 10*3/uL (ref 0.7–4.0)
MCHC: 33.7 g/dL (ref 30.0–36.0)
MCV: 88 fl (ref 78.0–100.0)
Monocytes Absolute: 1 10*3/uL (ref 0.1–1.0)
Monocytes Relative: 8.2 % (ref 3.0–12.0)
Neutro Abs: 8.5 10*3/uL — ABNORMAL HIGH (ref 1.4–7.7)
Neutrophils Relative %: 71.4 % (ref 43.0–77.0)
Platelets: 232 10*3/uL (ref 150.0–400.0)
RBC: 4.87 Mil/uL (ref 3.87–5.11)
RDW: 13.1 % (ref 11.5–15.5)
WBC: 11.9 10*3/uL — ABNORMAL HIGH (ref 4.0–10.5)

## 2018-11-19 LAB — POC URINALSYSI DIPSTICK (AUTOMATED)
Bilirubin, UA: NEGATIVE
Blood, UA: NEGATIVE
Glucose, UA: NEGATIVE
Ketones, UA: NEGATIVE
Leukocytes, UA: NEGATIVE
Nitrite, UA: NEGATIVE
Protein, UA: NEGATIVE
Spec Grav, UA: 1.025 (ref 1.010–1.025)
Urobilinogen, UA: 0.2 E.U./dL
pH, UA: 5 (ref 5.0–8.0)

## 2018-11-19 MED ORDER — CIPROFLOXACIN HCL 500 MG PO TABS: 500.0000 mg | ORAL_TABLET | Freq: Two times a day (BID) | ORAL | 0 refills | Status: AC

## 2018-11-19 MED ORDER — TRAMADOL HCL 50 MG PO TABS: 50.0000 mg | ORAL_TABLET | Freq: Three times a day (TID) | ORAL | 0 refills | Status: AC | PRN

## 2018-11-19 MED ORDER — METRONIDAZOLE 500 MG PO TABS: 500.0000 mg | ORAL_TABLET | Freq: Three times a day (TID) | ORAL | 0 refills | Status: AC

## 2018-11-19 NOTE — Patient Instructions (Signed)
A few things to remember from today's visit:   Left flank pain - Plan: CBC with Differential/Platelet, POCT Urinalysis Dipstick (Automated)  Left lower quadrant abdominal pain - Plan: CT Abdomen Pelvis W Contrast, traMADol (ULTRAM) 50 MG tablet  Diarrhea, unspecified type  Follow a bland diet for the next few days, small meals, adequate hydration.   GET HELP RIGHT AWAY IF:   The pain is does not go away within 2 hours.  Sudden severe/worsening pain.  You keep throwing up (vomiting).  The pain changes and is only in the right or left part of the belly.  Not being able to pass gas or poop.  You have bloody or tarry looking poop.   MAKE SURE YOU:   Understand these instructions.  Will watch your condition.  Will get help right away if you are not doing well or get worse.   If symptoms are persistent please arrange a follow up appointment.    Please be sure medication list is accurate. If a new problem present, please set up appointment sooner than planned today.

## 2018-11-19 NOTE — Progress Notes (Signed)
ACUTE VISIT   HPI:  Chief Complaint  Patient presents with  . Abdominal Pain    [name] is a 43 y.o. female, who is here today complaining of left-sided abdominal pain that is started last night, sudden onset. Pain is constant, exacerbated by movement and with some activities, is "closed to" 10/10. Pain is alleviated by being still.  Pain is not radiated. No associated chills, fever, sore throat, cough, dyspnea, wheezing, nausea, vomiting, or urinary symptoms. + Fatigue and decreased appetite.  Similar symptoms in 2018, diagnosed with acute diverticulitis. Yesterday she started with loose stools, x3 yesterday and x1 today. She has not noted blood in the stool or melena.  Pain is slowly getting worse. She denies anosmia and ageusia.  She has not tried OTC medications. Negative for sick contact or recent travel.   Review of Systems  Constitutional: Positive for activity change. Negative for unexpected weight change.  HENT: Negative for rhinorrhea, sore throat and trouble swallowing.   Cardiovascular: Negative for leg swelling.  Gastrointestinal: Negative for abdominal distention.  Genitourinary: Negative for dysuria, hematuria, vaginal bleeding and vaginal discharge.  Musculoskeletal: Negative for arthralgias and back pain.  Skin: Negative for color change and rash.  Rest see pertinent positives and negatives per HPI.   Current Outpatient Medications on File Prior to Visit  Medication Sig Dispense Refill  . Ascorbic Acid (VITAMIN C) 100 MG tablet Take 100 mg by mouth daily.    . fluticasone (FLONASE) 50 MCG/ACT nasal spray Place 1 spray into both nostrils 2 (two) times daily. 16 g 0  . Probiotic Product (PROBIOTIC DAILY PO) Take by mouth.     No current facility-administered medications on file prior to visit.      Past Medical History:  Diagnosis Date  . Abnormal Pap smear   . Dysmenorrhea   . Endometriosis   . Fibroid   . Heart murmur   .  History of fainting spells of unknown cause   . Hormone disorder   . Infertility, female   . Migraine with aura    No Known Allergies  Social History   Socioeconomic History  . Marital status: Married    Spouse name: Not on file  . Number of children: Not on file  . Years of education: Not on file  . Highest education level: Not on file  Occupational History  . Not on file  Social Needs  . Financial resource strain: Not on file  . Food insecurity    Worry: Not on file    Inability: Not on file  . Transportation needs    Medical: Not on file    Non-medical: Not on file  Tobacco Use  . Smoking status: Never Smoker  . Smokeless tobacco: Never Used  Substance and Sexual Activity  . Alcohol use: Yes    Alcohol/week: 3.0 standard drinks    Types: 3 Standard drinks or equivalent per week  . Drug use: No  . Sexual activity: Yes    Birth control/protection: Surgical    Comment: hysterecomy  Lifestyle  . Physical activity    Days per week: Not on file    Minutes per session: Not on file  . Stress: Not on file  Relationships  . Social Herbalist on phone: Not on file    Gets together: Not on file    Attends religious service: Not on file    Active member of club or organization: Not on file  Attends meetings of clubs or organizations: Not on file    Relationship status: Not on file  Other Topics Concern  . Not on file  Social History Narrative  . Not on file    Vitals:   11/19/18 1245  BP: 124/82  Pulse: 60  Resp: 12  Temp: 97.6 F (36.4 C)  SpO2: 99%   Body mass index is 39.84 kg/m.   Physical Exam  Nursing note and vitals reviewed. Constitutional: She is oriented to person, place, and time. She appears well-developed. She does not appear ill. No distress.  HENT:  Head: Atraumatic.  Mouth/Throat: Oropharynx is clear and moist and mucous membranes are normal.  Eyes: Conjunctivae are normal. No scleral icterus.  Cardiovascular: Normal rate  and regular rhythm.  No murmur heard. Respiratory: Effort normal and breath sounds normal. No respiratory distress.  GI: Soft. Bowel sounds are normal. She exhibits no distension and no mass. There is no hepatomegaly. There is abdominal tenderness in the left upper quadrant and left lower quadrant. There is guarding. There is no rigidity and no rebound.  Musculoskeletal:        General: No edema.     Lumbar back: She exhibits no tenderness, no edema and no deformity.  Lymphadenopathy:    She has no cervical adenopathy.       Right: No supraclavicular adenopathy present.       Left: No supraclavicular adenopathy present.  Neurological: She is alert and oriented to person, place, and time. She has normal strength.  Skin: Skin is warm. No rash noted. No erythema.  Psychiatric: She has a normal mood and affect.  Well groomed, good eye contact.    ASSESSMENT AND PLAN:  Ms. Gabrielle Gilbert was seen today for abdominal pain.  Diagnoses and all orders for this visit:  Left lower quadrant abdominal pain We discussed possible etiologies. Because she had similar symptoms in 01/2017, when abdominal CT showed acute diverticulitis, empiric treatment with antibiotics were started. She understands side effects of antibiotics. Abdominal CT scan will be scheduled. Recommend following soft bland diet. Adequate hydration. Clearly instructed about warning signs.  -     CBC with Differential/Platelet -     POCT Urinalysis Dipstick (Automated) -     CT Abdomen Pelvis W Contrast; Future -     metroNIDAZOLE (FLAGYL) 500 MG tablet; Take 1 tablet (500 mg total) by mouth 3 (three) times daily for 7 days. -     ciprofloxacin (CIPRO) 500 MG tablet; Take 1 tablet (500 mg total) by mouth 2 (two) times daily for 7 days. -     traMADol (ULTRAM) 50 MG tablet; Take 1 tablet (50 mg total) by mouth every 8 (eight) hours as needed for up to 5 days.  Diarrhea, unspecified type Symptoms could be related to beginning of viral  gastroenteritis. Instructed to monitor for new symptoms. Adequate hand hygiene and social distancing recommended.  Further recommendation will be given according to lab results.  -     CBC with Differential/Platelet -     POCT Urinalysis Dipstick (Automated)    Return if symptoms worsen or fail to improve.   -[name] was advised to seek immediate medical attention if sudden worsening symptoms or to follow if they persist or if new concerns arise.    Betty G. Martinique, MD  [facility]. [place] office.

## 2018-11-20 ENCOUNTER — Ambulatory Visit
Admission: RE | Admit: 2018-11-20 | Discharge: 2018-11-20 | Disposition: A | Discharge status: Home / Self Care | Payer: 59 | Source: Ambulatory Visit | Attending: Family Medicine | Admitting: Family Medicine

## 2018-11-20 DIAGNOSIS — R1032 Left lower quadrant pain: Secondary | ICD-10-CM

## 2018-11-20 MED ORDER — IOPAMIDOL (ISOVUE-300) INJECTION 61%
100.0000 mL | Freq: Once | INTRAVENOUS | Status: AC | PRN
  Administered 2018-11-20: 100 mL via INTRAVENOUS

## 2018-11-21 ENCOUNTER — Telehealth: Payer: Self-pay

## 2018-11-21 NOTE — Telephone Encounter (Signed)
Results were discussed with pt, see result note. Betty Martinique, MD

## 2018-11-21 NOTE — Telephone Encounter (Signed)
Diane from [facility] called to five a STAT result for the CT Abd/pelvis of acute Left lower descending diverticulitis in patient.  Please see report.

## 2018-12-10 ENCOUNTER — Telehealth: Payer: Self-pay

## 2018-12-10 NOTE — Telephone Encounter (Signed)
Covid-19 screening questions   Do you now or have you had a fever in the last 14 days no   Do you have any respiratory symptoms of shortness of breath or cough now or in the last 14 days no  Do you have any family members or close contacts with diagnosed or suspected Covid-19 in the past 14 days no  Have you been tested for Covid-19 and found to be positive no          

## 2018-12-11 ENCOUNTER — Other Ambulatory Visit: Payer: Self-pay

## 2018-12-11 ENCOUNTER — Ambulatory Visit (INDEPENDENT_AMBULATORY_CARE_PROVIDER_SITE_OTHER): Payer: 59 | Admitting: Nurse Practitioner

## 2018-12-11 ENCOUNTER — Encounter: Payer: Self-pay | Admitting: Nurse Practitioner

## 2018-12-11 VITALS — BP 120/84 | HR 66 | Temp 97.9°F | Ht 61.0 in | Wt 204.4 lb

## 2018-12-11 DIAGNOSIS — K625 Hemorrhage of anus and rectum: Secondary | ICD-10-CM | POA: Diagnosis not present

## 2018-12-11 DIAGNOSIS — K5732 Diverticulitis of large intestine without perforation or abscess without bleeding: Secondary | ICD-10-CM | POA: Diagnosis not present

## 2018-12-11 MED ORDER — GOLYTELY 236 G PO SOLR: 4000.0000 mL | Freq: Once | ORAL | 0 refills | Status: AC

## 2018-12-11 NOTE — Patient Instructions (Signed)
If you are age 41 or older, your body mass index should be between 23-30. Your Body mass index is 38.62 kg/m. If this is out of the aforementioned range listed, please consider follow up with your Primary Care Provider.  If you are age 54 or younger, your body mass index should be between 19-25. Your Body mass index is 38.62 kg/m. If this is out of the aformentioned range listed, please consider follow up with your Primary Care Provider.   You have been scheduled for a colonoscopy. Please follow written instructions given to you at your visit today.  Please pick up your prep supplies at the pharmacy within the next 1-3 days. If you use inhalers (even only as needed), please bring them with you on the day of your procedure. Your physician has requested that you go to www.startemmi.com and enter the access code given to you at your visit today. This web site gives a general overview about your procedure. However, you should still follow specific instructions given to you by our office regarding your preparation for the procedure.  We have sent the following medications to your pharmacy for you to pick up at your convenience: Golytely  You have been given a High Fiber Diet.  Thank you for choosing me and [facility].   Tye Savoy, NP

## 2018-12-11 NOTE — Progress Notes (Signed)
ASSESSMENT / PLAN:    43 yo Gilbert with recurrent diverticulitis documented by CT scan  In 2018 and June 2020. Symptoms resolved with antibiotics. She recently had a small amount of blood in stool, probably perirectal in nature but other etiologies should be excluded.  -Arrange for colonoscopy to be done in 4-6 weeks to rule out underlying neoplasm. The risks and benefits of colonoscopy with possible polypectomy / biopsies were discussed and the patient agrees to proceed. -in the interim, advised to call for any recurrent diverticulitis symptoms    HPI:    Chief Complaint:   Recent diverticulitis  Patient is a 43 year old Gilbert, new to the practice, self-referred for management of diverticulitis.  She is status post cholecystectomy 2009, hysterectomy 2019 . In August 2018 a CTAP scan (stone protocol) revealed diverticulitis at the area of the splenic flexure.  She recently had another episode of acute diverticulitis in mid June.  Patient saw PCP at the time, labs fairly unremarkable. .  Labs otherwise unremarkable.  CT scan with contrast showed acute left-sided diverticulitis of the descending colon without perforation or abscess.  Patient was prescribed Cipro and Flagyl for 5 days.  She completed nearly all of the antibiotics.  Could not quite complete the 7 days as the antibiotics caused abdominal pain and loose stool.  While on antibiotics she did have some episodes of blood in stool .  Off antibiotics now for nearly 2 weeks and feels fine.  No abdominal pain and bowel movements are back to normal.  Patient wanted to establish care with [place] for management of any future episodes of diverticulitis.    Patient has no chronic GI complaints.  Grandmother had colorectal cancer at age 32, no other relatives with colon cancer.  Data Reviewed:   11/19/18  WBC 11.9, hgb 14.5, platelets 232, UA negative.   CTAP w/ contrast IMPRESSION: Acute left descending colon diverticulitis  without evidence of perforation, abscess or obstruction. Remote cholecystectomy and hysterectomy   Past Medical History:  Diagnosis Date  . Abnormal Pap smear   . Diverticulitis   . Dysmenorrhea   . Endometriosis   . Fibroid   . Gallstones   . Heart murmur   . History of fainting spells of unknown cause   . Hormone disorder   . Infertility, Gilbert   . Migraine with aura   . Rosacea      Past Surgical History:  Procedure Laterality Date  . CERVICAL BIOPSY  W/ LOOP ELECTRODE EXCISION    . CESAREAN SECTION  12/02/2011   Procedure: CESAREAN SECTION;  Surgeon: Marvene Staff, MD;  Location: [facility];  Service: Gynecology;  Laterality: N/A;  . CHOLECYSTECTOMY    . CYSTOSCOPY N/A 05/07/2018   Procedure: CYSTOSCOPY;  Surgeon: Salvadore Dom, MD;  Location: [facility];  Service: Gynecology;  Laterality: N/A;  . DILATION AND CURETTAGE OF UTERUS    . history of leep     1998  . HYSTEROSCOPY    . LAPAROSCOPIC OVARIAN CYSTECTOMY    . LAPAROSCOPY    . LEEP    . MYOMECTOMY     robotic myomectomy, Dr Kerin Perna   . TOTAL LAPAROSCOPIC HYSTERECTOMY WITH SALPINGECTOMY Bilateral 05/07/2018   Procedure: TOTAL LAPAROSCOPIC HYSTERECTOMY WITH SALPINGECTOMY;  Surgeon: Salvadore Dom, MD;  Location: [facility];  Service: Gynecology;  Laterality: Bilateral;  extended stay recovery  . WISDOM TOOTH  EXTRACTION     Family History  Problem Relation Age of Onset  . Hyperlipidemia Mother   . Heart disease Mother   . Hypertension Mother   . Fibroids Mother   . Hyperlipidemia Father   . Heart disease Father   . Hypertension Father   . Heart disease Sister   . Colon cancer Paternal Grandmother   . Anesthesia problems Neg Hx   . Esophageal cancer Neg Hx    Social History   Tobacco Use  . Smoking status: Never Smoker  . Smokeless tobacco: Never Used  Substance Use Topics  . Alcohol use: Yes    Alcohol/week: 3.0 standard drinks    Types: 3  Standard drinks or equivalent per week  . Drug use: No   Current Outpatient Medications  Medication Sig Dispense Refill  . Ascorbic Acid (VITAMIN C) 100 MG tablet Take 100 mg by mouth daily.    . fluticasone (FLONASE) 50 MCG/ACT nasal spray Place 1 spray into both nostrils 2 (two) times daily. (Patient not taking: Reported on 12/11/2018) 16 g 0  . mometasone (ELOCON) 0.1 % cream 1 application as needed.    . Probiotic Product (PROBIOTIC DAILY PO) Take by mouth as needed.      No current facility-administered medications for this visit.    No Known Allergies   Review of Systems: Positive for back pain, night sweats.  All other systems reviewed and negative except where noted in HPI.   Creatinine clearance cannot be calculated (Patient's most recent lab result is older than the maximum 21 days allowed.)   Physical Exam:    Wt Readings from Last 3 Encounters:  12/11/18 204 lb 6 oz (92.7 kg)  08/23/18 204 lb (92.5 kg)  06/04/18 203 lb 9.6 oz (92.4 kg)    BP 120/84   Pulse 66   Temp 97.9 F (36.6 C)   Ht 5\' 1"  (1.549 m)   Wt 204 lb 6 oz (92.7 kg)   LMP 04/27/2018 (Exact Date)   BMI 38.62 kg/m  Constitutional:  Pleasant Gilbert in no acute distress. Psychiatric: Normal mood and affect. Behavior is normal. EENT: Pupils normal.  Conjunctivae are normal. No scleral icterus. Neck supple.  Cardiovascular: Normal rate, regular rhythm. No edema Pulmonary/chest: Effort normal and breath sounds normal. No wheezing, rales or rhonchi. Abdominal: Soft, nondistended, nontender. Bowel sounds active throughout. There are no masses palpable. No hepatomegaly. Neurological: Alert and oriented to person place and time. Skin: Skin is warm and dry. No rashes noted.  Tye Savoy, NP  12/11/2018, 8:59 AM

## 2018-12-11 NOTE — Progress Notes (Signed)
Attending Physician's Attestation   I have reviewed the chart.   I agree with the Advanced Practitioner's note, impression, and recommendations with any updates as below.    Camila Maita Mansouraty, MD [facility] Advanced [facility]oscopy Office # [phone]  

## 2019-01-02 ENCOUNTER — Other Ambulatory Visit (INDEPENDENT_AMBULATORY_CARE_PROVIDER_SITE_OTHER): Payer: 59

## 2019-01-02 ENCOUNTER — Other Ambulatory Visit: Payer: Self-pay

## 2019-01-02 DIAGNOSIS — Z0189 Encounter for other specified special examinations: Secondary | ICD-10-CM

## 2019-01-02 DIAGNOSIS — Z0000 Encounter for general adult medical examination without abnormal findings: Secondary | ICD-10-CM

## 2019-01-03 LAB — COMPREHENSIVE METABOLIC PANEL
ALT: 14 IU/L (ref 0–32)
AST: 15 IU/L (ref 0–40)
Albumin/Globulin Ratio: 1.7 (ref 1.2–2.2)
Albumin: 4 g/dL (ref 3.8–4.8)
Alkaline Phosphatase: 61 IU/L (ref 39–117)
BUN/Creatinine Ratio: 14 (ref 9–23)
BUN: 12 mg/dL (ref 6–24)
Bilirubin Total: 0.8 mg/dL (ref 0.0–1.2)
CO2: 22 mmol/L (ref 20–29)
Calcium: 8.4 mg/dL — ABNORMAL LOW (ref 8.7–10.2)
Chloride: 104 mmol/L (ref 96–106)
Creatinine, Ser: 0.84 mg/dL (ref 0.57–1.00)
GFR calc Af Amer: 101 mL/min/{1.73_m2} (ref >59)
GFR calc non Af Amer: 87 mL/min/{1.73_m2} (ref >59)
Globulin, Total: 2.3 g/dL (ref 1.5–4.5)
Glucose: 81 mg/dL (ref 65–99)
Potassium: 4.2 mmol/L (ref 3.5–5.2)
Sodium: 140 mmol/L (ref 134–144)
Total Protein: 6.3 g/dL (ref 6.0–8.5)

## 2019-01-03 LAB — LIPID PANEL
Chol/HDL Ratio: 3.2 ratio (ref 0.0–4.4)
Cholesterol, Total: 180 mg/dL (ref 100–199)
HDL: 56 mg/dL (ref >39)
LDL Calculated: 107 mg/dL — ABNORMAL HIGH (ref 0–99)
Triglycerides: 84 mg/dL (ref 0–149)
VLDL Cholesterol Cal: 17 mg/dL (ref 5–40)

## 2019-01-03 LAB — CBC
Hematocrit: 41.8 % (ref 34.0–46.6)
Hemoglobin: 14.1 g/dL (ref 11.1–15.9)
MCH: 29.6 pg (ref 26.6–33.0)
MCHC: 33.7 g/dL (ref 31.5–35.7)
MCV: 88 fL (ref 79–97)
Platelets: 227 10*3/uL (ref 150–450)
RBC: 4.77 x10E6/uL (ref 3.77–5.28)
RDW: 12.9 % (ref 11.7–15.4)
WBC: 6.2 10*3/uL (ref 3.4–10.8)

## 2019-01-03 LAB — HEMOGLOBIN A1C
Est. average glucose Bld gHb Est-mCnc: 88 mg/dL
Hgb A1c MFr Bld: 4.7 % — ABNORMAL LOW (ref 4.8–5.6)

## 2019-01-03 LAB — TSH: TSH: 3.1 u[IU]/mL (ref 0.450–4.500)

## 2019-01-14 ENCOUNTER — Telehealth: Payer: Self-pay | Admitting: Gastroenterology

## 2019-01-14 ENCOUNTER — Encounter: Payer: Self-pay | Admitting: Gastroenterology

## 2019-01-14 NOTE — Telephone Encounter (Signed)
Error

## 2019-01-14 NOTE — Telephone Encounter (Signed)

## 2019-01-15 ENCOUNTER — Other Ambulatory Visit: Payer: Self-pay

## 2019-01-15 ENCOUNTER — Encounter: Payer: Self-pay | Admitting: Gastroenterology

## 2019-01-15 ENCOUNTER — Ambulatory Visit (AMBULATORY_SURGERY_CENTER): Payer: 59 | Admitting: Gastroenterology

## 2019-01-15 VITALS — BP 119/57 | HR 73 | Temp 98.5°F | Resp 18 | Ht 61.0 in | Wt 204.0 lb

## 2019-01-15 DIAGNOSIS — D124 Benign neoplasm of descending colon: Secondary | ICD-10-CM

## 2019-01-15 DIAGNOSIS — K5730 Diverticulosis of large intestine without perforation or abscess without bleeding: Secondary | ICD-10-CM | POA: Diagnosis not present

## 2019-01-15 DIAGNOSIS — K625 Hemorrhage of anus and rectum: Secondary | ICD-10-CM

## 2019-01-15 DIAGNOSIS — D120 Benign neoplasm of cecum: Secondary | ICD-10-CM

## 2019-01-15 MED ORDER — SODIUM CHLORIDE 0.9 % IV SOLN: 500.0000 mL | Freq: Once | INTRAVENOUS | Status: DC

## 2019-01-15 NOTE — Progress Notes (Signed)
Called to room to assist during endoscopic procedure.  Patient ID and intended procedure confirmed with present staff. Received instructions for my participation in the procedure from the performing physician.  

## 2019-01-15 NOTE — Op Note (Signed)
[facility] Patient Name: Gabrielle Gilbert Procedure Date: 01/15/2019 11:30 AM MRN: [id number] Endoscopist: Justice Britain , MD Age: 43 Referring MD:  Date of Birth: 24-Jan-1976 Gender: Female Account #: [id number] Procedure:                Colonoscopy Indications:              This is the patient's first colonoscopy,                            Diverticulitis, Follow-up of diverticulitis Medicines:                Monitored Anesthesia Care Procedure:                Pre-Anesthesia Assessment:                           - Prior to the procedure, a History and Physical                            was performed, and patient medications and                            allergies were reviewed. The patient's tolerance of                            previous anesthesia was also reviewed. The risks                            and benefits of the procedure and the sedation                            options and risks were discussed with the patient.                            All questions were answered, and informed consent                            was obtained. Prior Anticoagulants: The patient has                            taken no previous anticoagulant or antiplatelet                            agents. ASA Grade Assessment: II - A patient with                            mild systemic disease. After reviewing the risks                            and benefits, the patient was deemed in                            satisfactory condition to undergo the procedure.  After obtaining informed consent, the colonoscope                            was passed under direct vision. Throughout the                            procedure, the patient's blood pressure, pulse, and                            oxygen saturations were monitored continuously. The                            Colonoscope was introduced through the anus and                            advanced to the the  cecum, identified by                            appendiceal orifice and ileocecal valve. The                            colonoscopy was performed without difficulty. The                            patient tolerated the procedure. The quality of the                            bowel preparation was good. The terminal ileum,                            ileocecal valve, appendiceal orifice, and rectum                            were photographed. Scope In: 11:40:04 AM Scope Out: 11:57:41 AM Scope Withdrawal Time: 0 hours 14 minutes 15 seconds  Total Procedure Duration: 0 hours 17 minutes 37 seconds  Findings:                 Skin tags were found on perianal exam.                           The digital rectal exam findings include                            hemorrhoids. Pertinent negatives include no                            palpable rectal lesions.                           The terminal ileum and ileocecal valve appeared                            normal.  Three sessile polyps were found in the descending                            colon (2) and cecum (1). The polyps were 2 to 6 mm                            in size. These polyps were removed with a cold                            snare. Resection and retrieval were complete.                           Multiple small-mouthed diverticula were found in                            the recto-sigmoid colon and sigmoid colon.                           Normal mucosa was found in the entire colon                            otherwise.                           Non-bleeding non-thrombosed internal hemorrhoids                            were found during retroflexion, during perianal                            exam and during digital exam. The hemorrhoids were                            Grade II (internal hemorrhoids that prolapse but                            reduce spontaneously). Complications:            No immediate  complications. Estimated Blood Loss:     Estimated blood loss was minimal. Impression:               - Perianal skin tags found on perianal exam.                           - Hemorrhoids found on digital rectal exam.                           - The examined portion of the ileum was normal.                           - Three 2 to 6 mm polyps in the descending colon                            and in the cecum, removed with a cold snare.  Resected and retrieved.                           - Diverticulosis in the recto-sigmoid colon and in                            the sigmoid colon.                           - Normal mucosa in the entire examined colon                            otherwise.                           - Non-bleeding non-thrombosed internal hemorrhoids. Recommendation:           - The patient will be observed post-procedure,                            until all discharge criteria are met.                           - Discharge patient to home.                           - Patient has a contact number available for                            emergencies. The signs and symptoms of potential                            delayed complications were discussed with the                            patient. Return to normal activities tomorrow.                            Written discharge instructions were provided to the                            patient.                           - High fiber diet.                           - Use FiberCon 1 tablet PO daily.                           - Continue present medications.                           - Await pathology results.                           - Repeat colonoscopy in 3/10/10/08 years for  surveillance based on pathology results.                           - If patient continues to have bouts of                            diverticulitis, then consider referral to                             colorectal surgery for consideration of resection.                           - The findings and recommendations were discussed                            with the patient. Justice Britain, MD 01/15/2019 12:04:45 PM

## 2019-01-15 NOTE — Progress Notes (Signed)
Pt's states no medical or surgical changes since previsit or office visit. 

## 2019-01-15 NOTE — Progress Notes (Signed)
PT taken to PACU. Monitors in place. VSS. Report given to RN. 

## 2019-01-15 NOTE — Patient Instructions (Signed)
Handouts given for polyps, hemorrhoids, diverticulosis and high fiber diet.  Use FiberCon 1 tablet daily.  Await pathology results.  YOU HAD AN ENDOSCOPIC PROCEDURE TODAY AT [facility]:   Refer to the procedure report that was given to you for any specific questions about what was found during the examination.  If the procedure report does not answer your questions, please call your gastroenterologist to clarify.  If you requested that your care partner not be given the details of your procedure findings, then the procedure report has been included in a sealed envelope for you to review at your convenience later.  YOU SHOULD EXPECT: Some feelings of bloating in the abdomen. Passage of more gas than usual.  Walking can help get rid of the air that was put into your GI tract during the procedure and reduce the bloating. If you had a lower endoscopy (such as a colonoscopy or flexible sigmoidoscopy) you may notice spotting of blood in your stool or on the toilet paper. If you underwent a bowel prep for your procedure, you may not have a normal bowel movement for a few days.  Please Note:  You might notice some irritation and congestion in your nose or some drainage.  This is from the oxygen used during your procedure.  There is no need for concern and it should clear up in a day or so.  SYMPTOMS TO REPORT IMMEDIATELY:   Following lower endoscopy (colonoscopy or flexible sigmoidoscopy):  Excessive amounts of blood in the stool  Significant tenderness or worsening of abdominal pains  Swelling of the abdomen that is new, acute  Fever of 100F or higher For urgent or emergent issues, a gastroenterologist can be reached at any hour by calling [phone].   DIET:  We do recommend a small meal at first, but then you may proceed to your regular diet.  Drink plenty of fluids but you should avoid alcoholic beverages for 24 hours.  ACTIVITY:  You should plan to take it easy for the  rest of today and you should NOT DRIVE or use heavy machinery until tomorrow (because of the sedation medicines used during the test).    FOLLOW UP: Our staff will call the number listed on your records 48-72 hours following your procedure to check on you and address any questions or concerns that you may have regarding the information given to you following your procedure. If we do not reach you, we will leave a message.  We will attempt to reach you two times.  During this call, we will ask if you have developed any symptoms of COVID 19. If you develop any symptoms (ie: fever, flu-like symptoms, shortness of breath, cough etc.) before then, please call [phone].  If you test positive for Covid 19 in the 2 weeks post procedure, please call and report this information to [place].    If any biopsies were taken you will be contacted by phone or by letter within the next 1-3 weeks.  Please call us at [phone] if you have not heard about the biopsies in 3 weeks.    SIGNATURES/CONFIDENTIALITY: You and/or your care partner have signed paperwork which will be entered into your electronic medical record.  These signatures attest to the fact that that the information above on your After Visit Summary has been reviewed and is understood.  Full responsibility of the confidentiality of this discharge information lies with you and/or your care-partner.

## 2019-01-17 ENCOUNTER — Telehealth: Payer: Self-pay | Admitting: *Deleted

## 2019-01-17 ENCOUNTER — Telehealth: Payer: Self-pay

## 2019-01-17 NOTE — Telephone Encounter (Signed)
  Follow up Call-  Call back number 01/15/2019  Post procedure Call Back phone  # [phone]  Permission to leave phone message Yes  Some recent data might be hidden     Patient questions:   Mailbox if full.

## 2019-01-17 NOTE — Telephone Encounter (Signed)
Left voice mail

## 2019-01-21 ENCOUNTER — Encounter: Payer: Self-pay | Admitting: Gastroenterology

## 2019-02-20 ENCOUNTER — Other Ambulatory Visit: Payer: Self-pay | Admitting: Obstetrics and Gynecology

## 2019-02-20 ENCOUNTER — Telehealth: Payer: Self-pay | Admitting: Obstetrics and Gynecology

## 2019-02-20 DIAGNOSIS — Z1231 Encounter for screening mammogram for malignant neoplasm of breast: Secondary | ICD-10-CM

## 2019-02-20 NOTE — Telephone Encounter (Signed)
Patient would like to have her hormone levels checked.

## 2019-02-20 NOTE — Telephone Encounter (Signed)
Call to patient. Patient states that since her hysterectomy in 05/2018, she has gained about 15 pounds. Working out 5-6 days a week, so unsure what is causing weight gain. Patient also states she is "moodier" around the time she would normally get her period. Also experiencing sweating only in the am, "not a full hot flash." OV recommended. Patient states she will be out of town until next Tuesday. Patient scheduled for 02-28-2019 at 1630. Patient agreeable to date and time of appointment.   Routing to provider and will close encounter.

## 2019-02-26 ENCOUNTER — Other Ambulatory Visit: Payer: Self-pay

## 2019-02-26 NOTE — Progress Notes (Signed)
GYNECOLOGY  VISIT   HPI: 43 y.o.   Married White or Caucasian Not Hispanic or Latino  female   [phone] with Patient's last menstrual period was 04/27/2018 (exact date).   here for vasomotor symptoms and weight gain.   The patient is s/p TLH/BS in 12/19. She has gained 5 lbs in the last 6 months, up 13 lbs in the last 10 months. Recent normal TSH. She c/o a couple of month h/o hot flashes in the morning with sweats. No night sweats. Only bothered after she wakes up in the am while she is still in bed. Tolerable, occurring ~qd.  Only has coffee in the am (in General), drinks ETOH on the weekends.  She is working out 4-6 days a week she does an hour of cross fit. For 3 weeks she added 2 days a week of biking 3.5-6 miles at a time.  Doesn't feel she is over eating.   GYNECOLOGIC HISTORY: Patient's last menstrual period was 04/27/2018 (exact date). Contraception: Hysterectomy Menopausal hormone therapy: None        OB History    Gravida  5   Para  2   Term  2   Preterm  0   AB  3   Living  2     SAB  3   TAB  0   Ectopic  0   Multiple  0   Live Births  1              Patient Active Problem List   Diagnosis Date Noted  . Status post laparoscopic hysterectomy 05/07/2018  . Migraine with aura   . Diverticula of colon 03/01/2017  . Class 2 obesity with body mass index (BMI) of 37.0 to 37.9 in adult 03/01/2017  . Syncope 02/28/2017  . Routine health maintenance 05/01/2012  . Postpartum care following cesarean delivery (6/28) 12/02/2011    Past Medical History:  Diagnosis Date  . Abnormal Pap smear   . Diverticulitis   . Dysmenorrhea   . Endometriosis   . Fibroid   . Gallstones   . Heart murmur   . History of fainting spells of unknown cause   . Hormone disorder   . Infertility, female   . Migraine with aura   . Rosacea     Past Surgical History:  Procedure Laterality Date  . CERVICAL BIOPSY  W/ LOOP ELECTRODE EXCISION    . CESAREAN SECTION  12/02/2011    Procedure: CESAREAN SECTION;  Surgeon: Marvene Staff, MD;  Location: [facility];  Service: Gynecology;  Laterality: N/A;  . CHOLECYSTECTOMY    . CYSTOSCOPY N/A 05/07/2018   Procedure: CYSTOSCOPY;  Surgeon: Salvadore Dom, MD;  Location: [facility];  Service: Gynecology;  Laterality: N/A;  . DILATION AND CURETTAGE OF UTERUS    . history of leep     1998  . HYSTEROSCOPY    . LAPAROSCOPIC OVARIAN CYSTECTOMY    . LAPAROSCOPY    . LEEP    . MYOMECTOMY     robotic myomectomy, Dr Kerin Perna   . TOTAL LAPAROSCOPIC HYSTERECTOMY WITH SALPINGECTOMY Bilateral 05/07/2018   Procedure: TOTAL LAPAROSCOPIC HYSTERECTOMY WITH SALPINGECTOMY;  Surgeon: Salvadore Dom, MD;  Location: [facility];  Service: Gynecology;  Laterality: Bilateral;  extended stay recovery  . WISDOM TOOTH EXTRACTION      Current Outpatient Medications  Medication Sig Dispense Refill  . Ascorbic Acid (VITAMIN C) 100 MG tablet Take 100 mg by mouth daily.    Marland Kitchen  mometasone (ELOCON) 0.1 % cream 1 application as needed.    . fluticasone (FLONASE) 50 MCG/ACT nasal spray Place 1 spray into both nostrils 2 (two) times daily. (Patient not taking: Reported on 12/11/2018) 16 g 0   No current facility-administered medications for this visit.      ALLERGIES: Patient has no known allergies.  Family History  Problem Relation Age of Onset  . Hyperlipidemia Mother   . Heart disease Mother   . Hypertension Mother   . Fibroids Mother   . Hyperlipidemia Father   . Heart disease Father   . Hypertension Father   . Heart disease Sister   . Colon cancer Paternal Grandmother   . Anesthesia problems Neg Hx   . Esophageal cancer Neg Hx     Social History   Socioeconomic History  . Marital status: Married    Spouse name: Not on file  . Number of children: Not on file  . Years of education: Not on file  . Highest education level: Not on file  Occupational History  . Not on file  Social Needs  .  Financial resource strain: Not on file  . Food insecurity    Worry: Not on file    Inability: Not on file  . Transportation needs    Medical: Not on file    Non-medical: Not on file  Tobacco Use  . Smoking status: Never Smoker  . Smokeless tobacco: Never Used  Substance and Sexual Activity  . Alcohol use: Yes    Alcohol/week: 3.0 standard drinks    Types: 3 Standard drinks or equivalent per week  . Drug use: No  . Sexual activity: Yes    Birth control/protection: Surgical    Comment: hysterecomy  Lifestyle  . Physical activity    Days per week: Not on file    Minutes per session: Not on file  . Stress: Not on file  Relationships  . Social Herbalist on phone: Not on file    Gets together: Not on file    Attends religious service: Not on file    Active member of club or organization: Not on file    Attends meetings of clubs or organizations: Not on file    Relationship status: Not on file  . Intimate partner violence    Fear of current or ex partner: Not on file    Emotionally abused: Not on file    Physically abused: Not on file    Forced sexual activity: Not on file  Other Topics Concern  . Not on file  Social History Narrative  . Not on file    Review of Systems  Constitutional: Negative.        Weight gain Night sweats  HENT: Negative.   Eyes: Negative.   Respiratory: Negative.   Cardiovascular: Negative.   Gastrointestinal: Negative.   Genitourinary: Negative.   Musculoskeletal: Negative.   Skin: Negative.   Neurological: Negative.   Endo/Heme/Allergies: Negative.   Psychiatric/Behavioral: Negative.     PHYSICAL EXAMINATION:    BP 122/82 (BP Location: Right Arm, Patient Position: Sitting, Cuff Size: Large)   Pulse 64   Temp (!) 97.2 F (36.2 C) (Skin)   Wt 209 lb 4.8 oz (94.9 kg)   LMP 04/27/2018 (Exact Date)   BMI 39.55 kg/m     General appearance: alert, cooperative and appears stated age  ASSESSMENT Weight gain Tolerable hot  flashes    PLAN Discussed exercise, healthy eating Discussed weight watchers  Discussed the option of medical weight loss clinic.   An After Visit Summary was printed and given to the patient.  ~15 minutes face to face time of which over 50% was spent in counseling.

## 2019-02-28 ENCOUNTER — Other Ambulatory Visit: Payer: Self-pay

## 2019-02-28 ENCOUNTER — Encounter: Payer: Self-pay | Admitting: Obstetrics and Gynecology

## 2019-02-28 ENCOUNTER — Ambulatory Visit (INDEPENDENT_AMBULATORY_CARE_PROVIDER_SITE_OTHER): Payer: 59 | Admitting: Obstetrics and Gynecology

## 2019-02-28 VITALS — BP 122/82 | HR 64 | Temp 97.2°F | Wt 209.3 lb

## 2019-02-28 DIAGNOSIS — R635 Abnormal weight gain: Secondary | ICD-10-CM | POA: Diagnosis not present

## 2019-02-28 DIAGNOSIS — R232 Flushing: Secondary | ICD-10-CM

## 2019-02-28 NOTE — Patient Instructions (Signed)
Menopause Menopause is the normal time of life when menstrual periods stop completely. It is usually confirmed by 12 months without a menstrual period. The transition to menopause (perimenopause) most often happens between the ages of 45 and 55. During perimenopause, hormone levels change in your body, which can cause symptoms and affect your health. Menopause may increase your risk for:  Loss of bone (osteoporosis), which causes bone breaks (fractures).  Depression.  Hardening and narrowing of the arteries (atherosclerosis), which can cause heart attacks and strokes. What are the causes? This condition is usually caused by a natural change in hormone levels that happens as you get older. The condition may also be caused by surgery to remove both ovaries (bilateral oophorectomy). What increases the risk? This condition is more likely to start at an earlier age if you have certain medical conditions or treatments, including:  A tumor of the pituitary gland in the brain.  A disease that affects the ovaries and hormone production.  Radiation treatment for cancer.  Certain cancer treatments, such as chemotherapy or hormone (anti-estrogen) therapy.  Heavy smoking and excessive alcohol use.  Family history of early menopause. This condition is also more likely to develop earlier in women who are very thin. What are the signs or symptoms? Symptoms of this condition include:  Hot flashes.  Irregular menstrual periods.  Night sweats.  Changes in feelings about sex. This could be a decrease in sex drive or an increased comfort around your sexuality.  Vaginal dryness and thinning of the vaginal walls. This may cause painful intercourse.  Dryness of the skin and development of wrinkles.  Headaches.  Problems sleeping (insomnia).  Mood swings or irritability.  Memory problems.  Weight gain.  Hair growth on the face and chest.  Bladder infections or problems with urinating. How  is this diagnosed? This condition is diagnosed based on your medical history, a physical exam, your age, your menstrual history, and your symptoms. Hormone tests may also be done. How is this treated? In some cases, no treatment is needed. You and your health care provider should make a decision together about whether treatment is necessary. Treatment will be based on your individual condition and preferences. Treatment for this condition focuses on managing symptoms. Treatment may include:  Menopausal hormone therapy (MHT).  Medicines to treat specific symptoms or complications.  Acupuncture.  Vitamin or herbal supplements. Before starting treatment, make sure to let your health care provider know if you have a personal or family history of:  Heart disease.  Breast cancer.  Blood clots.  Diabetes.  Osteoporosis. Follow these instructions at home: Lifestyle  Do not use any products that contain nicotine or tobacco, such as cigarettes and e-cigarettes. If you need help quitting, ask your health care provider.  Get at least 30 minutes of physical activity on 5 or more days each week.  Avoid alcoholic and caffeinated beverages, as well as spicy foods. This may help prevent hot flashes.  Get 7-8 hours of sleep each night.  If you have hot flashes, try: ? Dressing in layers. ? Avoiding things that may trigger hot flashes, such as spicy food, warm places, or stress. ? Taking slow, deep breaths when a hot flash starts. ? Keeping a fan in your home and office.  Find ways to manage stress, such as deep breathing, meditation, or journaling.  Consider going to group therapy with other women who are having menopause symptoms. Ask your health care provider about recommended group therapy meetings. Eating and   drinking  Eat a healthy, balanced diet that contains whole grains, lean protein, low-fat dairy, and plenty of fruits and vegetables.  Your health care provider may recommend  adding more soy to your diet. Foods that contain soy include tofu, tempeh, and soy milk.  Eat plenty of foods that contain calcium and vitamin D for bone health. Items that are rich in calcium include low-fat milk, yogurt, beans, almonds, sardines, broccoli, and kale. Medicines  Take over-the-counter and prescription medicines only as told by your health care provider.  Talk with your health care provider before starting any herbal supplements. If prescribed, take vitamins and supplements as told by your health care provider. These may include: ? Calcium. Women age 51 and older should get 1,200 mg (milligrams) of calcium every day. ? Vitamin D. Women need 600-800 International Units of vitamin D each day. ? Vitamins B12 and B6. Aim for 50 micrograms of B12 and 1.5 mg of B6 each day. General instructions  Keep track of your menstrual periods, including: ? When they occur. ? How heavy they are and how long they last. ? How much time passes between periods.  Keep track of your symptoms, noting when they start, how often you have them, and how long they last.  Use vaginal lubricants or moisturizers to help with vaginal dryness and improve comfort during sex.  Keep all follow-up visits as told by your health care provider. This is important. This includes any group therapy or counseling. Contact a health care provider if:  You are still having menstrual periods after age 55.  You have pain during sex.  You have not had a period for 12 months and you develop vaginal bleeding. Get help right away if:  You have: ? Severe depression. ? Excessive vaginal bleeding. ? Pain when you urinate. ? A fast or irregular heart beat (palpitations). ? Severe headaches. ? Abdomen (abdominal) pain or severe indigestion.  You fell and you think you have a broken bone.  You develop leg or chest pain.  You develop vision problems.  You feel a lump in your breast. Summary  Menopause is the normal  time of life when menstrual periods stop completely. It is usually confirmed by 12 months without a menstrual period.  The transition to menopause (perimenopause) most often happens between the ages of 45 and 55.  Symptoms can be managed through medicines, lifestyle changes, and complementary therapies such as acupuncture.  Eat a balanced diet that is rich in nutrients to promote bone health and heart health and to manage symptoms during menopause. This information is not intended to replace advice given to you by your health care provider. Make sure you discuss any questions you have with your health care provider. Document Released: 08/13/2003 Document Revised: 05/05/2017 Document Reviewed: 06/25/2016 Elsevier Patient Education  2020 [employer]  

## 2019-03-01 LAB — FOLLICLE STIMULATING HORMONE: FSH: 6 m[IU]/mL

## 2019-04-08 ENCOUNTER — Other Ambulatory Visit: Payer: Self-pay

## 2019-04-08 ENCOUNTER — Ambulatory Visit
Admission: RE | Admit: 2019-04-08 | Discharge: 2019-04-08 | Disposition: A | Discharge status: Home / Self Care | Payer: 59 | Source: Ambulatory Visit | Attending: Obstetrics and Gynecology | Admitting: Obstetrics and Gynecology

## 2019-04-08 DIAGNOSIS — Z1231 Encounter for screening mammogram for malignant neoplasm of breast: Secondary | ICD-10-CM

## 2019-06-03 IMAGING — CT CT RENAL STONE PROTOCOL
2 of 4 series · 16 of 46 positions shown, 18 images · non-contrast
Comparison: None.

CLINICAL DATA: LEFT flank pain beginning last night.

EXAM:
CT ABDOMEN AND PELVIS WITHOUT CONTRAST
TECHNIQUE: Multidetector CT imaging of the abdomen and pelvis was performed
following the standard protocol without IV contrast.

[Series 3: renal stone 5.0 · axial · 0.73mm/px · z∈[+573,+1028]mm · 13 of 101 slices shown, 15 images]
[im 5/101  soft-tissue]
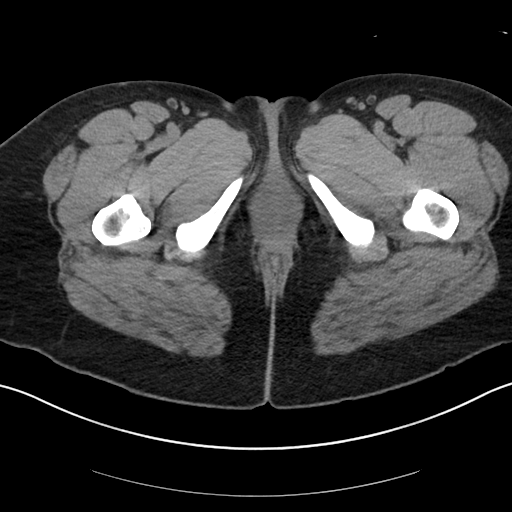
[im 5/101  bone]
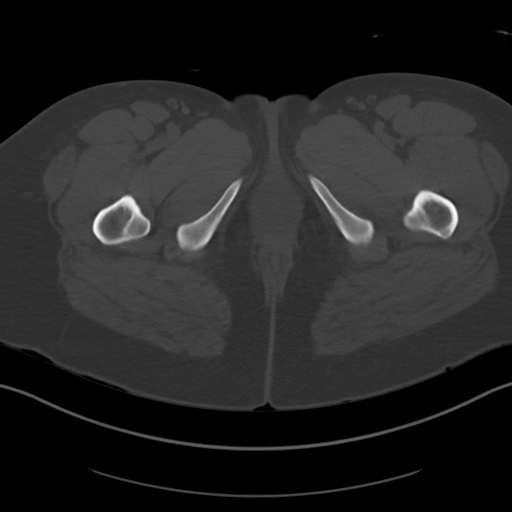
[im 13/101  soft-tissue]
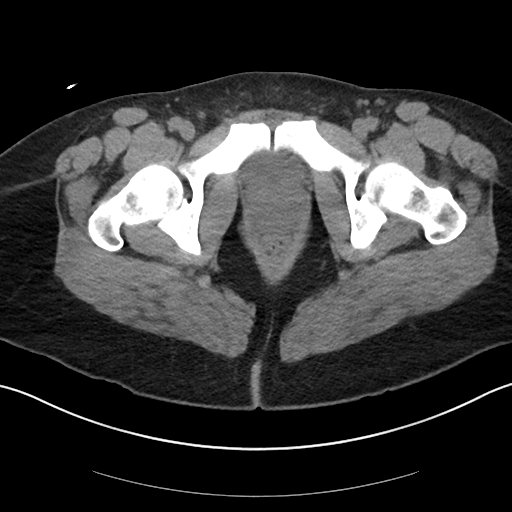
[im 21/101  soft-tissue]
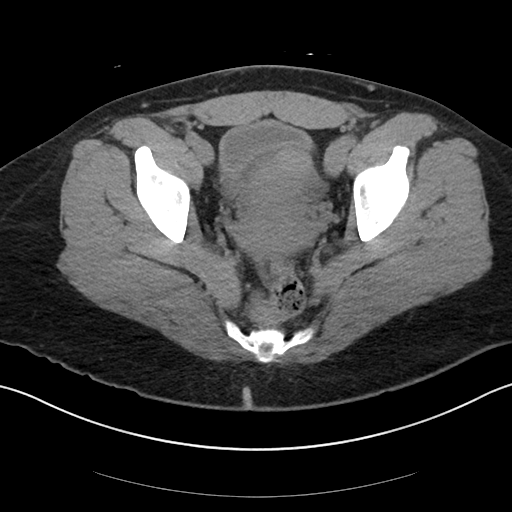
[im 30/101  soft-tissue]
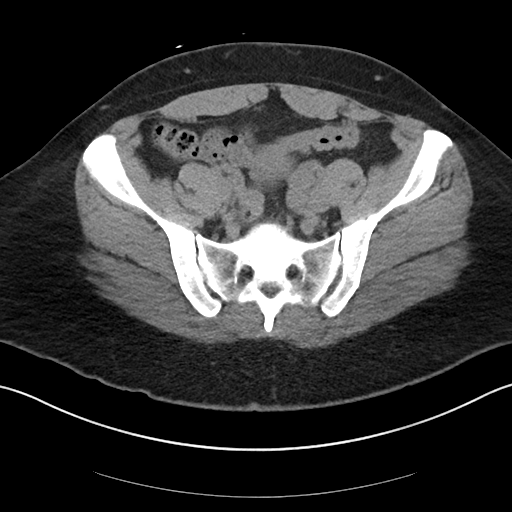
[im 34/101  soft-tissue]
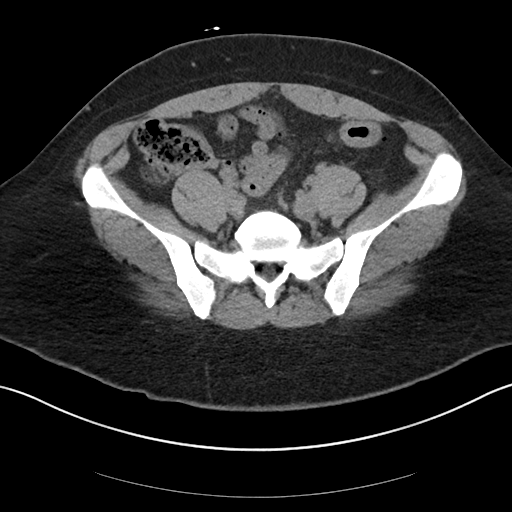
[im 42/101  soft-tissue]
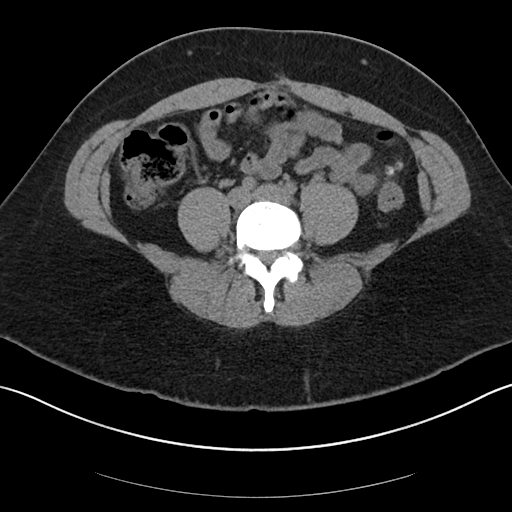
[im 51/101  soft-tissue]
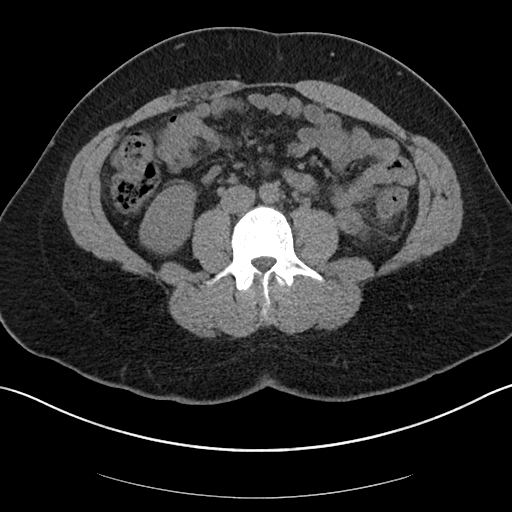
[im 59/101  soft-tissue]
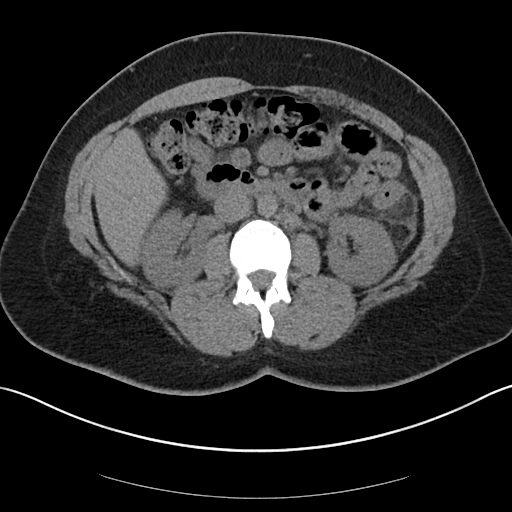
[im 67/101  soft-tissue]
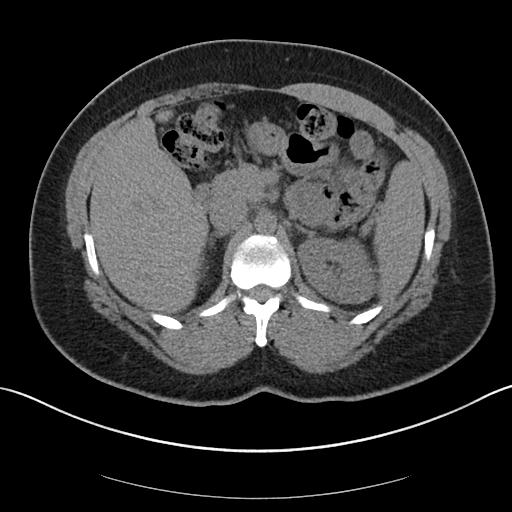
[im 67/101  bone]
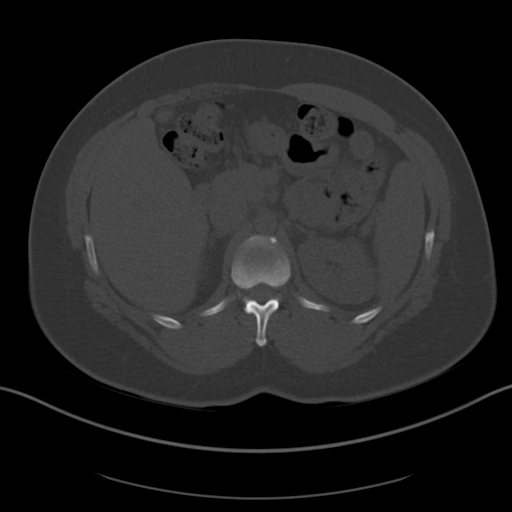
[im 71/101  soft-tissue]
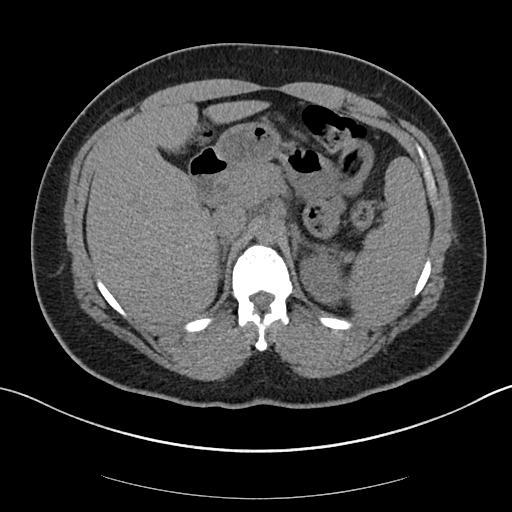
[im 80/101  soft-tissue]
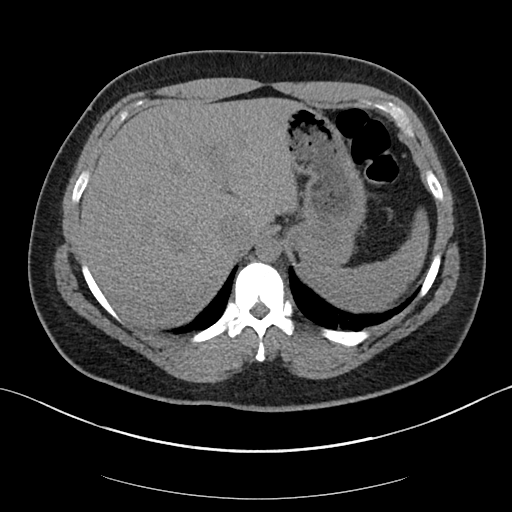
[im 88/101  soft-tissue]
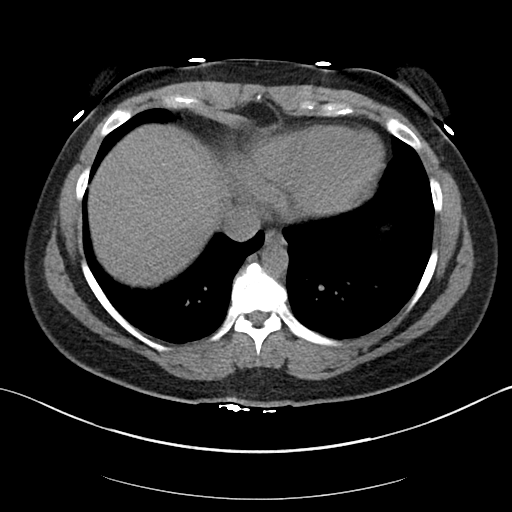
[im 96/101  soft-tissue]
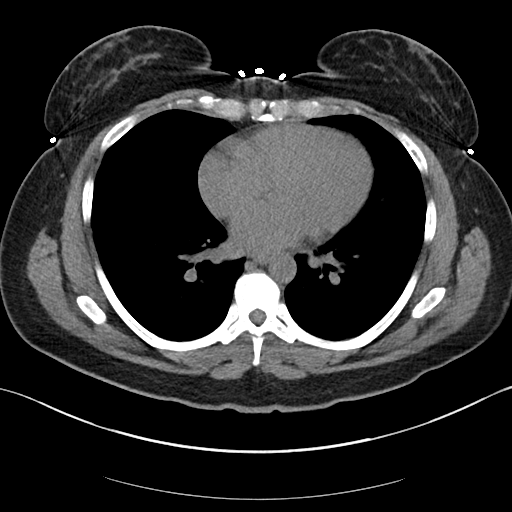

[Series 5: renal stone 3.0 cor · coronal · 0.84mm/px · 3 of 101 slices shown]
[im 34/101  soft-tissue]
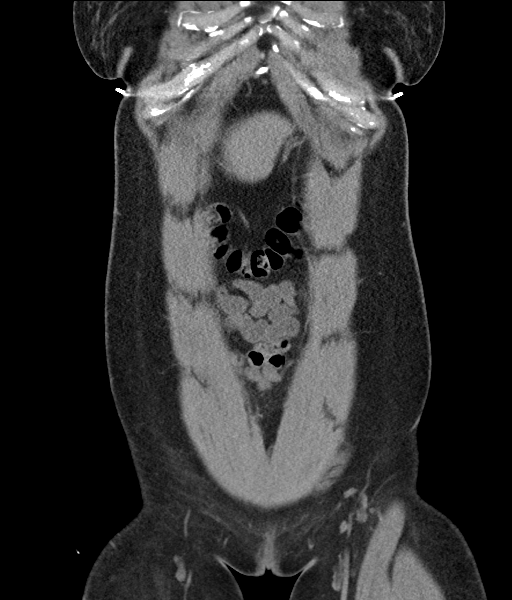
[im 45/101  soft-tissue]
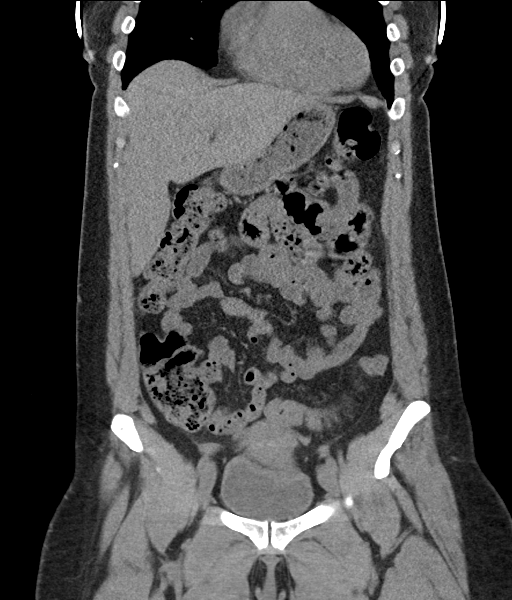
[im 56/101  soft-tissue]
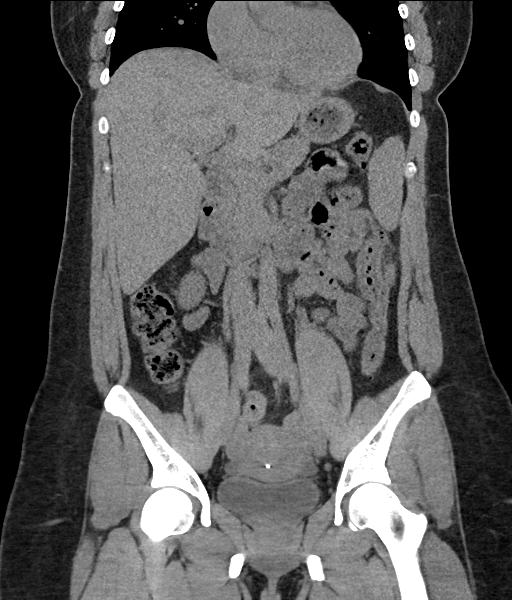

[16 of 46 positions shown; findings below may reference images not displayed]

FINDINGS: LOWER CHEST: Lung bases are clear. The visualized heart size is
normal. No pericardial effusion.

HEPATOBILIARY: Status post cholecystectomy. Normal noncontrast CT
liver.

PANCREAS: Normal.

SPLEEN: Normal.

ADRENALS/URINARY TRACT: Kidneys are orthotopic, demonstrating normal
size and morphology. No nephrolithiasis, hydronephrosis; limited
assessment for renal masses on this nonenhanced examination. The
unopacified ureters are normal in course and caliber. Urinary
bladder is partially distended and unremarkable. Normal adrenal
glands.

STOMACH/BOWEL: The stomach, small and large bowel are normal in
course and caliber without inflammatory changes, sensitivity
decreased by lack of enteric contrast. Moderate colonic
diverticulosis with superimposed short segment of wall thickening
and inflammatory changes associated with dense diverticula within
colonic splenic flexure. Pericolonic fat stranding and focal
thickened peritoneal reflection. Normal appendix.

VASCULAR/LYMPHATIC: Aortoiliac vessels are normal in course and
caliber. No lymphadenopathy by CT size criteria.

REPRODUCTIVE: IUD in central uterus.

OTHER: Trace free fluid in the pelvis is likely physiologic.

MUSCULOSKELETAL: Non-acute.
IMPRESSION: 1. Acute diverticulitis involving colonic splenic flexure.
2. No nephrolithiasis, no obstructive uropathy.

## 2019-06-24 ENCOUNTER — Other Ambulatory Visit: Payer: Self-pay

## 2019-06-24 ENCOUNTER — Encounter: Payer: Self-pay | Admitting: Cardiology

## 2019-06-24 ENCOUNTER — Ambulatory Visit (INDEPENDENT_AMBULATORY_CARE_PROVIDER_SITE_OTHER): Payer: 59 | Admitting: Cardiology

## 2019-06-24 VITALS — BP 112/70 | HR 57 | Ht 61.0 in | Wt 199.0 lb

## 2019-06-24 DIAGNOSIS — Q211 Atrial septal defect, unspecified: Secondary | ICD-10-CM

## 2019-06-24 DIAGNOSIS — Q2112 Patent foramen ovale: Secondary | ICD-10-CM | POA: Insufficient documentation

## 2019-06-24 NOTE — Patient Instructions (Signed)
Medication Instructions:  Your physician recommends that you continue on your current medications as directed. Please refer to the Current Medication list given to you today.  *If you need a refill on your cardiac medications before your next appointment, please call your pharmacy*  Lab Work: NONE If you have labs (blood work) drawn today and your tests are completely normal, you will receive your results only by: Marland Kitchen MyChart Message (if you have MyChart) OR . A paper copy in the mail If you have any lab test that is abnormal or we need to change your treatment, we will call you to review the results.  Testing/Procedures: You had an EKG performed today  Your physician has requested that you have an echocardiogram. Echocardiography is a painless test that uses sound waves to create images of your heart. It provides your doctor with information about the size and shape of your heart and how well your heart's chambers and valves are working. This procedure takes approximately one hour. There are no restrictions for this procedure.    Follow-Up: At [facility], you and your health needs are our priority.  As part of our continuing mission to provide you with exceptional heart care, we have created designated Provider Care Teams.  These Care Teams include your primary Cardiologist (physician) and Advanced Practice Providers (APPs -  Physician Assistants and Nurse Practitioners) who all work together to provide you with the care you need, when you need it.  Your next appointment:   6 month(s)  The format for your next appointment:   In Person  Provider:   Jyl Heinz, MD  Other Instructions  Echocardiogram An echocardiogram is a procedure that uses painless sound waves (ultrasound) to produce an image of the heart. Images from an echocardiogram can provide important information about:  Signs of coronary artery disease (CAD).  Aneurysm detection. An aneurysm is a weak or damaged  part of an artery wall that bulges out from the normal force of blood pumping through the body.  Heart size and shape. Changes in the size or shape of the heart can be associated with certain conditions, including heart failure, aneurysm, and CAD.  Heart muscle function.  Heart valve function.  Signs of a past heart attack.  Fluid buildup around the heart.  Thickening of the heart muscle.  A tumor or infectious growth around the heart valves. Tell a health care provider about:  Any allergies you have.  All medicines you are taking, including vitamins, herbs, eye drops, creams, and over-the-counter medicines.  Any blood disorders you have.  Any surgeries you have had.  Any medical conditions you have.  Whether you are pregnant or may be pregnant. What are the risks? Generally, this is a safe procedure. However, problems may occur, including:  Allergic reaction to dye (contrast) that may be used during the procedure. What happens before the procedure? No specific preparation is needed. You may eat and drink normally. What happens during the procedure?   An IV tube may be inserted into one of your veins.  You may receive contrast through this tube. A contrast is an injection that improves the quality of the pictures from your heart.  A gel will be applied to your chest.  A wand-like tool (transducer) will be moved over your chest. The gel will help to transmit the sound waves from the transducer.  The sound waves will harmlessly bounce off of your heart to allow the heart images to be captured in real-time motion.  The images will be recorded on a computer. The procedure may vary among health care providers and hospitals. What happens after the procedure?  You may return to your normal, everyday life, including diet, activities, and medicines, unless your health care provider tells you not to do that. Summary  An echocardiogram is a procedure that uses painless sound  waves (ultrasound) to produce an image of the heart.  Images from an echocardiogram can provide important information about the size and shape of your heart, heart muscle function, heart valve function, and fluid buildup around your heart.  You do not need to do anything to prepare before this procedure. You may eat and drink normally.  After the echocardiogram is completed, you may return to your normal, everyday life, unless your health care provider tells you not to do that. This information is not intended to replace advice given to you by your health care provider. Make sure you discuss any questions you have with your health care provider. Document Revised: 09/13/2018 Document Reviewed: 06/25/2016 Elsevier Patient Education  [place].

## 2019-06-24 NOTE — Progress Notes (Signed)
Cardiology Office Note:    Date:  06/24/2019   ID:  Gabrielle Gilbert, DOB Aug 17, 1975, MRN [id number]  PCP:  Martinique, Betty G, MD  Cardiologist:  Jenean Lindau, MD   Referring MD: Martinique, Betty G, MD    ASSESSMENT:    1. Atrial septal defect   2. Patent foramen ovale with atrial septal aneurysm    PLAN:    In order of problems listed above:  1. Patent foramen ovale with atrial septal aneurysm with right-to-left shunting: I discussed my findings with the patient at extensive length.  I reviewed echocardiogram and TEE report from last evaluation and discussed with the patient at extensive length.  She is completely asymptomatic and not keen on any further evaluation other than echocardiogram.  This has been scheduled in the next few days.Patient will be seen in follow-up appointment in 6 months or earlier if the patient has any concerns.  Patient had multiple questions which were answered to her satisfaction.    Medication Adjustments/Labs and Tests Ordered: Current medicines are reviewed at length with the patient today.  Concerns regarding medicines are outlined above.  Orders Placed This Encounter  Procedures  . EKG 12-Lead  . ECHOCARDIOGRAM COMPLETE   No orders of the defined types were placed in this encounter.    Chief Complaint  Patient presents with  . Follow-up     History of Present Illness:    Gabrielle Gilbert is a 44 y.o. female.  Patient has past medical history of atrial septal aneurysm with patent foramen ovale with right-to-left shunting.  She denies any problems at this time and takes care of activities of daily living.  No chest pain orthopnea or PND.  She goes to the gym and exercises on a regular basis without any symptoms.  No history of migraines dizziness TIA or strokelike symptoms.  At the time of my evaluation, the patient is alert awake oriented and in no distress.  Past Medical History:  Diagnosis Date  . Abnormal Pap smear   . Diverticulitis    . Dysmenorrhea   . Endometriosis   . Fibroid   . Gallstones   . Heart murmur   . History of fainting spells of unknown cause   . Hormone disorder   . Infertility, female   . Migraine with aura   . Rosacea     Past Surgical History:  Procedure Laterality Date  . CERVICAL BIOPSY  W/ LOOP ELECTRODE EXCISION    . CESAREAN SECTION  12/02/2011   Procedure: CESAREAN SECTION;  Surgeon: Marvene Staff, MD;  Location: [facility];  Service: Gynecology;  Laterality: N/A;  . CHOLECYSTECTOMY    . CYSTOSCOPY N/A 05/07/2018   Procedure: CYSTOSCOPY;  Surgeon: Salvadore Dom, MD;  Location: [facility];  Service: Gynecology;  Laterality: N/A;  . DILATION AND CURETTAGE OF UTERUS    . history of leep     1998  . HYSTEROSCOPY    . LAPAROSCOPIC OVARIAN CYSTECTOMY    . LAPAROSCOPY    . LEEP    . MYOMECTOMY     robotic myomectomy, Dr Kerin Perna   . TOTAL LAPAROSCOPIC HYSTERECTOMY WITH SALPINGECTOMY Bilateral 05/07/2018   Procedure: TOTAL LAPAROSCOPIC HYSTERECTOMY WITH SALPINGECTOMY;  Surgeon: Salvadore Dom, MD;  Location: [facility];  Service: Gynecology;  Laterality: Bilateral;  extended stay recovery  . WISDOM TOOTH EXTRACTION      Current Medications: Current Meds  Medication Sig  . Ascorbic Acid (VITAMIN C) 100  MG tablet Take 100 mg by mouth daily.  . mometasone (ELOCON) 0.1 % cream 1 application as needed.     Allergies:   Patient has no known allergies.   Social History   Socioeconomic History  . Marital status: Married    Spouse name: Not on file  . Number of children: Not on file  . Years of education: Not on file  . Highest education level: Not on file  Occupational History  . Not on file  Tobacco Use  . Smoking status: Never Smoker  . Smokeless tobacco: Never Used  Substance and Sexual Activity  . Alcohol use: Yes    Alcohol/week: 3.0 standard drinks    Types: 3 Standard drinks or equivalent per week  . Drug use: No  . Sexual  activity: Yes    Birth control/protection: Surgical    Comment: hysterecomy  Other Topics Concern  . Not on file  Social History Narrative  . Not on file   Social Determinants of Health   Financial Resource Strain:   . Difficulty of Paying Living Expenses: Not on file  Food Insecurity:   . Worried About Charity fundraiser in the Last Year: Not on file  . Ran Out of Food in the Last Year: Not on file  Transportation Needs:   . Lack of Transportation (Medical): Not on file  . Lack of Transportation (Non-Medical): Not on file  Physical Activity:   . Days of Exercise per Week: Not on file  . Minutes of Exercise per Session: Not on file  Stress:   . Feeling of Stress : Not on file  Social Connections:   . Frequency of Communication with Friends and Family: Not on file  . Frequency of Social Gatherings with Friends and Family: Not on file  . Attends Religious Services: Not on file  . Active Member of Clubs or Organizations: Not on file  . Attends Archivist Meetings: Not on file  . Marital Status: Not on file     Family History: The patient's family history includes Colon cancer in her paternal grandmother; Fibroids in her mother; Heart disease in her father, mother, and sister; Hyperlipidemia in her father and mother; Hypertension in her father and mother. There is no history of Anesthesia problems or Esophageal cancer.  ROS:   Please see the history of present illness.    All other systems reviewed and are negative.  EKGs/Labs/Other Studies Reviewed:    The following studies were reviewed today: EKG reveals sinus rhythm and nonspecific ST-T changes.   Recent Labs: 01/02/2019: ALT 14; BUN 12; Creatinine, Ser 0.84; Hemoglobin 14.1; Platelets 227; Potassium 4.2; Sodium 140; TSH 3.100  Recent Lipid Panel    Component Value Date/Time   CHOL 180 01/02/2019 1323   TRIG 84 01/02/2019 1323   HDL 56 01/02/2019 1323   CHOLHDL 3.2 01/02/2019 1323   LDLCALC 107 (H)  01/02/2019 1323    Physical Exam:    VS:  BP 112/70   Pulse (!) 57   Ht 5\' 1"  (1.549 m)   Wt 199 lb (90.3 kg)   LMP 04/27/2018 (Exact Date)   SpO2 98%   BMI 37.60 kg/m     Wt Readings from Last 3 Encounters:  06/24/19 199 lb (90.3 kg)  02/28/19 209 lb 4.8 oz (94.9 kg)  01/15/19 204 lb (92.5 kg)     GEN: Patient is in no acute distress HEENT: Normal NECK: No JVD; No carotid bruits LYMPHATICS: No lymphadenopathy CARDIAC:  Hear sounds regular, 2/6 systolic murmur at the apex. RESPIRATORY:  Clear to auscultation without rales, wheezing or rhonchi  ABDOMEN: Soft, non-tender, non-distended MUSCULOSKELETAL:  No edema; No deformity  SKIN: Warm and dry NEUROLOGIC:  Alert and oriented x 3 PSYCHIATRIC:  Normal affect   Signed, Jenean Lindau, MD  06/24/2019 8:58 AM    [place]

## 2019-06-26 ENCOUNTER — Ambulatory Visit (HOSPITAL_BASED_OUTPATIENT_CLINIC_OR_DEPARTMENT_OTHER)
Admission: RE | Admit: 2019-06-26 | Discharge: 2019-06-26 | Disposition: A | Discharge status: Home / Self Care | Payer: 59 | Source: Ambulatory Visit | Attending: Cardiology | Admitting: Cardiology

## 2019-06-26 ENCOUNTER — Other Ambulatory Visit: Payer: Self-pay

## 2019-06-26 DIAGNOSIS — Q211 Atrial septal defect: Secondary | ICD-10-CM | POA: Diagnosis present

## 2019-06-26 NOTE — Progress Notes (Signed)
  Echocardiogram 2D Echocardiogram has been performed.  Gabrielle Gilbert 06/26/2019, 4:36 PM

## 2019-09-20 ENCOUNTER — Encounter: Payer: Self-pay | Admitting: Nurse Practitioner

## 2019-09-20 ENCOUNTER — Ambulatory Visit (INDEPENDENT_AMBULATORY_CARE_PROVIDER_SITE_OTHER): Payer: 59 | Admitting: Nurse Practitioner

## 2019-09-20 ENCOUNTER — Other Ambulatory Visit (INDEPENDENT_AMBULATORY_CARE_PROVIDER_SITE_OTHER): Payer: 59

## 2019-09-20 VITALS — BP 120/64 | HR 56 | Temp 97.9°F | Ht 61.0 in | Wt 194.2 lb

## 2019-09-20 DIAGNOSIS — R1013 Epigastric pain: Secondary | ICD-10-CM

## 2019-09-20 LAB — CBC
HCT: 43.1 % (ref 36.0–46.0)
Hemoglobin: 14.9 g/dL (ref 12.0–15.0)
MCHC: 34.7 g/dL (ref 30.0–36.0)
MCV: 87.6 fl (ref 78.0–100.0)
Platelets: 243 10*3/uL (ref 150.0–400.0)
RBC: 4.92 Mil/uL (ref 3.87–5.11)
RDW: 13.1 % (ref 11.5–15.5)
WBC: 11.1 10*3/uL — ABNORMAL HIGH (ref 4.0–10.5)

## 2019-09-20 LAB — COMPREHENSIVE METABOLIC PANEL
ALT: 16 U/L (ref 0–35)
AST: 17 U/L (ref 0–37)
Albumin: 4.2 g/dL (ref 3.5–5.2)
Alkaline Phosphatase: 63 U/L (ref 39–117)
BUN: 15 mg/dL (ref 6–23)
CO2: 29 mEq/L (ref 19–32)
Calcium: 9.4 mg/dL (ref 8.4–10.5)
Chloride: 103 mEq/L (ref 96–112)
Creatinine, Ser: 1.36 mg/dL — ABNORMAL HIGH (ref 0.40–1.20)
GFR: 42.75 mL/min — ABNORMAL LOW (ref >60.00)
Glucose, Bld: 97 mg/dL (ref 70–99)
Potassium: 3.9 mEq/L (ref 3.5–5.1)
Sodium: 140 mEq/L (ref 135–145)
Total Bilirubin: 0.6 mg/dL (ref 0.2–1.2)
Total Protein: 7.1 g/dL (ref 6.0–8.3)

## 2019-09-20 LAB — LIPASE: Lipase: 20 U/L (ref 11.0–59.0)

## 2019-09-20 NOTE — Patient Instructions (Signed)
If you are age 42 or older, your body mass index should be between 23-30. Your Body mass index is 36.69 kg/m. If this is out of the aforementioned range listed, please consider follow up with your Primary Care Provider.  If you are age 44 or younger, your body mass index should be between 19-25. Your Body mass index is 36.69 kg/m. If this is out of the aformentioned range listed, please consider follow up with your Primary Care Provider.   You have been scheduled for an endoscopy. Please follow written instructions given to you at your visit today. If you use inhalers (even only as needed), please bring them with you on the day of your procedure.  Your provider has requested that you go to the basement level for lab work before leaving today. Press "B" on the elevator. The lab is located at the first door on the left as you exit the elevator.  Due to recent changes in healthcare laws, you may see the results of your imaging and laboratory studies on MyChart before your provider has had a chance to review them.  We understand that in some cases there may be results that are confusing or concerning to you. Not all laboratory results come back in the same time frame and the provider may be waiting for multiple results in order to interpret others.  Please give [place] 48 hours in order for your provider to thoroughly review all the results before contacting the office for clarification of your results.

## 2019-09-20 NOTE — Progress Notes (Signed)
           IMPRESSION and PLAN:    44 year old female with PMH significant for diverticulitis, colon polyps, migraines, cholecystectomy  # Non-radiating epigastric pain.  --Consider PUD, musculoskeletal. Pancreatic / hepatobiliary etiology seems unlikely --One month's duration --No improvement with Tums, PPI or dietary changes --Obtain CBC, CMET, lipase --Will arrange for EGD to further evaluate epigastric pain. The risks and benefits of EGD were discussed and the patient agrees to proceed.    HPI:    Primary GI: Dr. Rush Landmark  Chief complaint : Abdominal pain  44 year old female with 1 month history of epigastric pain / indigestion type pain.   Pain does not radiate through to the back.  Pain occurs randomly but is definitely exacerbated by eating.  Some days the pain is constant, other days intermittent.  It varies from dull to a twisting type sensation.  Pain is not positional or related to physical activity and it has been known to wake her up at night .  She initially thought the pain might be related to constipation but pain didn't improve with BMs. Her bowels are moving fine now. She has tried Tums without relief.  She took omeprazole for 3-1/2 weeks but did not have any noticeable improvement.  She tried the brat diet without improvement.  No NSAID use.  No blood in stool . She has not had any associated nausea or vomiting.  No associated weight loss.  She is currently participating in [employer].  Review of systems:     No chest pain, no SOB, no fevers, no urinary sx   Past Medical History:  Diagnosis Date  . Abnormal Pap smear   . Diverticulitis   . Dysmenorrhea   . Endometriosis   . Fibroid   . Gallstones   . Heart murmur   . History of fainting spells of unknown cause   . Hormone disorder   . Infertility, female   . Migraine with aura   . Rosacea     Patient's surgical history, family medical history, social history, medications and allergies were all  reviewed in Epic   Creatinine clearance cannot be calculated (Patient's most recent lab result is older than the maximum 21 days allowed.)  Current Outpatient Medications  Medication Sig Dispense Refill  . Ascorbic Acid (VITAMIN C) 100 MG tablet Take 100 mg by mouth daily.    . fluticasone (FLONASE) 50 MCG/ACT nasal spray Place 1 spray into both nostrils 2 (two) times daily. (Patient not taking: Reported on 12/11/2018) 16 g 0  . mometasone (ELOCON) 0.1 % cream 1 application as needed.     No current facility-administered medications for this visit.    Physical Exam:     LMP 04/27/2018 (Exact Date)   GENERAL:  Pleasant female in NAD PSYCH: : Cooperative, normal affect CARDIAC:  RRR, no murmur heard, no peripheral edema PULM: Normal respiratory effort, lungs CTA bilaterally, no wheezing ABDOMEN:  Nondistended, soft, mild epigastric tenderness. No obvious masses, no hepatomegaly,  normal bowel sounds SKIN:  turgor, no lesions seen Musculoskeletal:  Normal muscle tone, normal strength NEURO: Alert and oriented x 3, no focal neurologic deficits   Tye Savoy , NP 09/20/2019, 1:36 PM

## 2019-09-23 NOTE — Progress Notes (Signed)
Attending Physician's Attestation   I have reviewed the chart.   I agree with the Advanced Practitioner's note, impression, and recommendations with any updates as below.    Meria Crilly Mansouraty, MD [facility] Office # [phone]  

## 2019-09-24 ENCOUNTER — Other Ambulatory Visit: Payer: Self-pay

## 2019-09-24 ENCOUNTER — Ambulatory Visit (AMBULATORY_SURGERY_CENTER): Payer: 59 | Admitting: Gastroenterology

## 2019-09-24 ENCOUNTER — Encounter: Payer: Self-pay | Admitting: Gastroenterology

## 2019-09-24 ENCOUNTER — Other Ambulatory Visit: Payer: Self-pay | Admitting: Gastroenterology

## 2019-09-24 VITALS — BP 137/92 | HR 60 | Temp 96.6°F | Resp 17 | Ht 61.0 in | Wt 194.0 lb

## 2019-09-24 DIAGNOSIS — R1013 Epigastric pain: Secondary | ICD-10-CM

## 2019-09-24 DIAGNOSIS — K2990 Gastroduodenitis, unspecified, without bleeding: Secondary | ICD-10-CM

## 2019-09-24 DIAGNOSIS — K2950 Unspecified chronic gastritis without bleeding: Secondary | ICD-10-CM

## 2019-09-24 DIAGNOSIS — K3189 Other diseases of stomach and duodenum: Secondary | ICD-10-CM

## 2019-09-24 DIAGNOSIS — K259 Gastric ulcer, unspecified as acute or chronic, without hemorrhage or perforation: Secondary | ICD-10-CM

## 2019-09-24 DIAGNOSIS — K2970 Gastritis, unspecified, without bleeding: Secondary | ICD-10-CM

## 2019-09-24 MED ORDER — SUCRALFATE 1 GM/10ML PO SUSP: 1.0000 g | Freq: Four times a day (QID) | ORAL | 1 refills | Status: DC

## 2019-09-24 MED ORDER — SODIUM CHLORIDE 0.9 % IV SOLN: 500.0000 mL | Freq: Once | INTRAVENOUS | Status: DC

## 2019-09-24 MED ORDER — ESOMEPRAZOLE MAGNESIUM 40 MG PO CPDR: 40.0000 mg | DELAYED_RELEASE_CAPSULE | Freq: Every day | ORAL | 3 refills | Status: DC

## 2019-09-24 NOTE — Progress Notes (Signed)
Temp taken by JB VS taken by DT 

## 2019-09-24 NOTE — Op Note (Signed)
[facility] Patient Name: Gabrielle Gilbert Procedure Date: 09/24/2019 2:10 PM MRN: [id number] Endoscopist: Justice Britain , MD Age: 44 Referring MD:  Date of Birth: 1976/03/29 Gender: Female Account #: [id number] Procedure:                Upper GI endoscopy Indications:              Epigastric abdominal pain, Dyspepsia, Indigestion Medicines:                Monitored Anesthesia Care Procedure:                Pre-Anesthesia Assessment:                           - Prior to the procedure, a History and Physical                            was performed, and patient medications and                            allergies were reviewed. The patient's tolerance of                            previous anesthesia was also reviewed. The risks                            and benefits of the procedure and the sedation                            options and risks were discussed with the patient.                            All questions were answered, and informed consent                            was obtained. Prior Anticoagulants: The patient has                            taken no previous anticoagulant or antiplatelet                            agents. ASA Grade Assessment: II - A patient with                            mild systemic disease. After reviewing the risks                            and benefits, the patient was deemed in                            satisfactory condition to undergo the procedure.                           After obtaining informed consent, the endoscope was  passed under direct vision. Throughout the                            procedure, the patient's blood pressure, pulse, and                            oxygen saturations were monitored continuously. The                            Endoscope was introduced through the mouth, and                            advanced to the second part of duodenum. The upper   GI endoscopy was accomplished without difficulty.                            The patient tolerated the procedure. Scope In: Scope Out: Findings:                 No gross lesions were noted in the entire esophagus.                           The Z-line was regular and was found 36 cm from the                            incisors.                           Multiple dispersed small to medium erosions with no                            bleeding and no stigmata of recent bleeding were                            found in the gastric body, at the incisura and in                            the gastric antrum. Some of the erosions were                            suggestive of healing prior ulcers.                           No other gross lesions were noted in the entire                            examined stomach. Biopsies were taken with a cold                            forceps for histology and Helicobacter pylori                            testing.  No gross lesions were noted in the duodenal bulb,                            in the first portion of the duodenum and in the                            second portion of the duodenum. Biopsies were taken                            with a cold forceps for histology to rule out                            enteropathy. Complications:            No immediate complications. Estimated Blood Loss:     Estimated blood loss was minimal. Impression:               - No gross lesions in esophagus.                           - Z-line regular, 36 cm from the incisors.                           - Erosive gastropathy with no bleeding and no                            stigmata of recent bleeding. Biopsied for HP.                           - No gross lesions in the duodenal bulb, in the                            first portion of the duodenum and in the second                            portion of the duodenum. Biopsied. Recommendation:            - The patient will be observed post-procedure,                            until all discharge criteria are met.                           - Discharge patient to home.                           - Patient has a contact number available for                            emergencies. The signs and symptoms of potential                            delayed complications were discussed with the  patient. Return to normal activities tomorrow.                            Written discharge instructions were provided to the                            patient.                           - Resume previous diet.                           - Start Nexium 40 mg daily (did not feel that she                            had effectiveness with Omeprazole previously).                           - Start Carafate liquid suspension 2-4 times daily.                            Start by taking once in the AM and once before                            bedtime. If somewhat helpful in coating stomach,                            would recommend trying to increase to up to four                            times daily by taking with a meal. Do not take any                            medication 1 hour before or after this medication                            however as it may interfere with absorption.                           - Would recommend a CTAP with IV/PO contrast to                            further evaluate the continued issues and discomfor                            that patient has been experiecing.                           - Follow up in clinic with PA Guenther or myself in                            3-6 weeks.                           -  The findings and recommendations were discussed                            with the patient. Justice Britain, MD 09/24/2019 2:33:52 PM

## 2019-09-24 NOTE — Progress Notes (Signed)
Report to PACU, RN, vss, BBS= Clear.  

## 2019-09-24 NOTE — Patient Instructions (Signed)
Please read handouts provided. Start Nexium 40 mg daily. Begin Carafate liquid suspension 2-4 times daily. Follow-up in clinic with PA [facility] or Dr. Rush Landmark in 3-6 weeks.        YOU HAD AN ENDOSCOPIC PROCEDURE TODAY AT [facility]:   Refer to the procedure report that was given to you for any specific questions about what was found during the examination.  If the procedure report does not answer your questions, please call your gastroenterologist to clarify.  If you requested that your care partner not be given the details of your procedure findings, then the procedure report has been included in a sealed envelope for you to review at your convenience later.  YOU SHOULD EXPECT: Some feelings of bloating in the abdomen. Passage of more gas than usual.  Walking can help get rid of the air that was put into your GI tract during the procedure and reduce the bloating. If you had a lower endoscopy (such as a colonoscopy or flexible sigmoidoscopy) you may notice spotting of blood in your stool or on the toilet paper. If you underwent a bowel prep for your procedure, you may not have a normal bowel movement for a few days.  Please Note:  You might notice some irritation and congestion in your nose or some drainage.  This is from the oxygen used during your procedure.  There is no need for concern and it should clear up in a day or so.  SYMPTOMS TO REPORT IMMEDIATELY:    Following upper endoscopy (EGD)  Vomiting of blood or coffee ground material  New chest pain or pain under the shoulder blades  Painful or persistently difficult swallowing  New shortness of breath  Fever of 100F or higher  Black, tarry-looking stools  For urgent or emergent issues, a gastroenterologist can be reached at any hour by calling [phone]. Do not use MyChart messaging for urgent concerns.    DIET:  We do recommend a small meal at first, but then you may proceed to your regular diet.   Drink plenty of fluids but you should avoid alcoholic beverages for 24 hours.  ACTIVITY:  You should plan to take it easy for the rest of today and you should NOT DRIVE or use heavy machinery until tomorrow (because of the sedation medicines used during the test).    FOLLOW UP: Our staff will call the number listed on your records 48-72 hours following your procedure to check on you and address any questions or concerns that you may have regarding the information given to you following your procedure. If we do not reach you, we will leave a message.  We will attempt to reach you two times.  During this call, we will ask if you have developed any symptoms of COVID 19. If you develop any symptoms (ie: fever, flu-like symptoms, shortness of breath, cough etc.) before then, please call [phone].  If you test positive for Covid 19 in the 2 weeks post procedure, please call and report this information to [place].    If any biopsies were taken you will be contacted by phone or by letter within the next 1-3 weeks.  Please call us at [phone] if you have not heard about the biopsies in 3 weeks.    SIGNATURES/CONFIDENTIALITY: You and/or your care partner have signed paperwork which will be entered into your electronic medical record.  These signatures attest to the fact that that the information above on your After Visit Summary  has been reviewed and is understood.  Full responsibility of the confidentiality of this discharge information lies with you and/or your care-partner. 

## 2019-09-24 NOTE — Progress Notes (Signed)
Called to room to assist during endoscopic procedure.  Patient ID and intended procedure confirmed with present staff. Received instructions for my participation in the procedure from the performing physician.  

## 2019-09-24 NOTE — Progress Notes (Signed)
Pharmacy needed clarification on Sucralfate. New rx sent to pharmacy. Per Dr Rush Landmark- if suspension is not covered, then tablets can be given.

## 2019-09-25 ENCOUNTER — Telehealth: Payer: Self-pay | Admitting: Gastroenterology

## 2019-09-25 MED ORDER — SUCRALFATE 1 G PO TABS: 1.0000 g | ORAL_TABLET | Freq: Three times a day (TID) | ORAL | 0 refills | Status: DC

## 2019-09-25 NOTE — Telephone Encounter (Signed)
Patient has been notified and aware. Agrees to new RX. Also reviewed how to make a slurry from the tablet.

## 2019-09-25 NOTE — Telephone Encounter (Signed)
OK to do the tablets for now. Ask patient if she is OK with this otherwise can move forward with attempt at PA. Thanks. GM

## 2019-09-25 NOTE — Addendum Note (Signed)
Addended by: Elias Else on: 09/25/2019 11:49 AM   Modules accepted: Orders

## 2019-09-25 NOTE — Telephone Encounter (Signed)
Pt stated that insurance will cover Carafate tablets but suspension requires a PA.

## 2019-09-25 NOTE — Telephone Encounter (Signed)
Ok to change Rx to Carafate 1 gm tablets QID ?

## 2019-09-26 ENCOUNTER — Telehealth: Payer: Self-pay

## 2019-09-26 NOTE — Telephone Encounter (Signed)
  Follow up Call-  Call back number 09/24/2019 01/15/2019  Post procedure Call Back phone  # [phone]  Permission to leave phone message Yes Yes  Some recent data might be hidden     Patient questions:  Do you have a fever, pain , or abdominal swelling? No. Pain Score  0 *  Have you tolerated food without any problems? Yes.    Have you been able to return to your normal activities? Yes.    Do you have any questions about your discharge instructions: Diet   No. Medications  No. Follow up visit  No.  Do you have questions or concerns about your Care? No.  Actions: * If pain score is 4 or above: No action needed, pain <4.  1. Have you developed a fever since your procedure? no  2.   Have you had an respiratory symptoms (SOB or cough) since your procedure? no  3.   Have you tested positive for COVID 19 since your procedure no  4.   Have you had any family members/close contacts diagnosed with the COVID 19 since your procedure?  no   If yes to any of these questions please route to Joylene John, RN and Erenest Rasher, RN

## 2019-09-29 ENCOUNTER — Encounter: Payer: Self-pay | Admitting: Gastroenterology

## 2019-10-17 ENCOUNTER — Encounter: Payer: Self-pay | Admitting: Nurse Practitioner

## 2019-10-17 ENCOUNTER — Other Ambulatory Visit: Payer: Self-pay | Admitting: Gastroenterology

## 2019-10-17 ENCOUNTER — Ambulatory Visit (INDEPENDENT_AMBULATORY_CARE_PROVIDER_SITE_OTHER): Payer: 59 | Admitting: Nurse Practitioner

## 2019-10-17 VITALS — BP 94/62 | HR 62 | Temp 98.2°F | Ht 61.0 in | Wt 195.0 lb

## 2019-10-17 DIAGNOSIS — R1013 Epigastric pain: Secondary | ICD-10-CM

## 2019-10-17 NOTE — Patient Instructions (Addendum)
If you are age 42 or older, your body mass index should be between 23-30. Your Body mass index is 36.84 kg/m. If this is out of the aforementioned range listed, please consider follow up with your Primary Care Provider.  If you are age 52 or younger, your body mass index should be between 19-25. Your Body mass index is 36.84 kg/m. If this is out of the aformentioned range listed, please consider follow up with your Primary Care Provider.   Continue Nexium once daily.  Complete remainder of the Carafate twice a day.  Follow up in 6 months, or if you have any pain before then.

## 2019-10-17 NOTE — Progress Notes (Signed)
IMPRESSION and PLAN:    44 year old female with PMH significant for diverticulitis, colon polyps, migraines, cholecystectomy   #  Epigastric pain --Recent EGD with biopsies revealing of erosive gastritis.  --Etiology unclear.  She was not taking NSAIDs, biopsies negative for H.pylori --She is 75% better since compared to when I saw her prior to the EGD.  Taking daily Nexium and twice daily Carafate --If her pain had not improved the plan was to obtain a CT scan of the abdomen and pelvis.  Since symptoms 75% better we will hold off on imaging, she agrees. --Since etiology of erosive gastritis is unknown I have asked her to continue daily Nexium for now.  She will complete remainder of Carafate prescription then can discontinue it.  --Follow-up in approximately 3 months.  At some point we can hopefully get off PPI.  --She will call the office for any recurrent epigastric pain   HPI:    Primary GI: Justice Britain, MD   Chief complaint : Follow-up abdominal pain  I saw patient on 09/20/2019 for evaluation of nonradiating epigastric pain which had not improved with tums, dietary changes or PPI.  Labs obtained including CMP which was unremarkable except for elevated creatinine of 1.3.  Lipase normal at 20.  CBC unremarkable except for mildly elevated WBC 11.1.  Patient scheduled for EGD which showed erosive gastropathy.  Postprocedure she was restarted on Nexium, started on Carafate CT scan of the abdomen and pelvis was recommended to further evaluate her abdominal pain  HISTORY SINCE LAST VISIT: Gabrielle Gilbert says she is about 75% better.  She has been taking Carafate twice daily and Nexium once a day.  She still gets the nonradiating epigastric pain about 1 time a week, mainly at night.  Patient is not sure how long it lasts because she ends up falling asleep.  No nausea or vomiting. No other GI complaints   Previous Endoscopic Evaluations: 09/24/19 EGD for epigastric pain No  gross lesions in esophagus. - Z-line regular, 36 cm from the incisors. - Erosive gastropathy with no bleeding and no stigmata of recent bleeding. Biopsied for HP. - No gross lesions in the duodenal bulb, in the first portion of the duodenum and in the second portion of the duodenum. Biopsied  Biopsies: gastric path c/w chronic inflammation and edema.  No H. pylori.  Duodenal biopsies negative  Review of systems:     No chest pain, no SOB, no fevers, no urinary sx   Past Medical History:  Diagnosis Date  . Abnormal Pap smear   . Diverticulitis   . Dysmenorrhea   . Endometriosis   . Fibroid   . Gallstones   . Heart murmur   . History of fainting spells of unknown cause   . Hormone disorder   . Infertility, female   . Migraine with aura   . Rosacea     Patient's surgical history, family medical history, social history, medications and allergies were all reviewed in Epic   Creatinine clearance cannot be calculated (Patient's most recent lab result is older than the maximum 21 days allowed.)  Current Outpatient Medications  Medication Sig Dispense Refill  . Ascorbic Acid (VITAMIN C) 100 MG tablet Take 100 mg by mouth daily.    . fluticasone (FLONASE) 50 MCG/ACT nasal spray Place 1 spray into both nostrils 2 (two) times daily. 16 g 0  . sucralfate (CARAFATE) 1 g tablet TAKE 1 TABLET (1 G TOTAL) BY MOUTH  4 (FOUR) TIMES DAILY - WITH MEALS AND AT BEDTIME. 120 tablet 0  . esomeprazole (NEXIUM) 40 MG capsule Take 1 capsule (40 mg total) by mouth daily for 1 dose. 30 capsule 3   No current facility-administered medications for this visit.    Physical Exam:     BP 94/62   Pulse 62   Temp 98.2 F (36.8 C)   Ht 5\' 1"  (1.549 m)   Wt 195 lb (88.5 kg)   LMP 04/27/2018 (Exact Date)   BMI 36.84 kg/m   GENERAL:  Pleasant female in NAD PSYCH: : Cooperative, normal affect CARDIAC:  RRR PULM: Normal respiratory effort, lungs CTA bilaterally, no wheezing ABDOMEN:  Nondistended, soft,  nontender. No obvious masses, no hepatomegaly,  normal bowel sounds SKIN:  turgor, no lesions seen Musculoskeletal:  Normal muscle tone, normal strength NEURO: Alert and oriented x 3, no focal neurologic deficits   Tye Savoy , NP 10/17/2019, 1:45 PM

## 2019-10-20 NOTE — Progress Notes (Signed)
Attending Physician's Attestation   I have reviewed the chart.   I agree with the Advanced Practitioner's note, impression, and recommendations with any updates as below. Glad to hear that she is doing better.  Hopefully she continues on that trajectory.  CT abdomen/pelvis if she does not have continued improvement.   Justice Britain, MD [facility] Office # [phone]

## 2019-11-12 ENCOUNTER — Other Ambulatory Visit: Payer: Self-pay | Admitting: Gastroenterology

## 2019-11-17 ENCOUNTER — Other Ambulatory Visit: Payer: Self-pay | Admitting: Gastroenterology

## 2020-03-16 ENCOUNTER — Other Ambulatory Visit: Payer: Self-pay | Admitting: Family Medicine

## 2020-03-16 DIAGNOSIS — Z1231 Encounter for screening mammogram for malignant neoplasm of breast: Secondary | ICD-10-CM

## 2020-04-08 ENCOUNTER — Other Ambulatory Visit: Payer: Self-pay

## 2020-04-08 ENCOUNTER — Ambulatory Visit
Admission: RE | Admit: 2020-04-08 | Discharge: 2020-04-08 | Disposition: A | Discharge status: Home / Self Care | Payer: No Typology Code available for payment source | Source: Ambulatory Visit

## 2020-04-08 DIAGNOSIS — Z1231 Encounter for screening mammogram for malignant neoplasm of breast: Secondary | ICD-10-CM

## 2020-07-06 NOTE — Progress Notes (Signed)
CARDIOLOGY OFFICE NOTE  Date:  07/08/2020    Gabrielle Gilbert Date of Birth: 29-Nov-1975 Medical Record #[id number]  PCP:  Martinique, Betty G, MD  Cardiologist:  Revankar    Chief Complaint  Patient presents with  . Follow-up    Seen for Dr. Geraldo Pitter    History of Present Illness: Gabrielle Gilbert is a 45 y.o. female who presents today for a follow up visit. Seen for Dr. Geraldo Pitter.   She has a history of an ASD/PFO with atrial septal aneurysm - last echo a year ago. No other cardiac issues noted. Was advised to be on full dose aspirin. Does have a history of migraines - but she really denies this - more associated with BCP use. Noted that she has declined TEE and other evaluation. Has been asymptomatic.   Last seen a little over one year ago by Dr. Geraldo Pitter - felt to be doing ok.   Comes in today. Here alone. She feels good. Has gotten back to exercise - she really likes weight training - trying to get back to aerobic activity but likes weight training the most. No chest pain. Not short of breath. Not dizzy or lightheaded. No passing out. Not on aspirin per prior recommendation. No migraine history in many years.   Past Medical History:  Diagnosis Date  . Abnormal Pap smear   . Diverticulitis   . Dysmenorrhea   . Endometriosis   . Fibroid   . Gallstones   . Heart murmur   . History of fainting spells of unknown cause   . Hormone disorder   . Infertility, female   . Migraine with aura   . Rosacea     Past Surgical History:  Procedure Laterality Date  . CERVICAL BIOPSY  W/ LOOP ELECTRODE EXCISION    . CESAREAN SECTION  12/02/2011   Procedure: CESAREAN SECTION;  Surgeon: Marvene Staff, MD;  Location: [facility];  Service: Gynecology;  Laterality: N/A;  . CHOLECYSTECTOMY    . CYSTOSCOPY N/A 05/07/2018   Procedure: CYSTOSCOPY;  Surgeon: Salvadore Dom, MD;  Location: [facility];  Service: Gynecology;  Laterality: N/A;  . DILATION AND CURETTAGE OF  UTERUS    . history of leep     1998  . HYSTEROSCOPY    . LAPAROSCOPIC OVARIAN CYSTECTOMY    . LAPAROSCOPY    . LEEP    . MYOMECTOMY     robotic myomectomy, Dr Kerin Perna   . TOTAL LAPAROSCOPIC HYSTERECTOMY WITH SALPINGECTOMY Bilateral 05/07/2018   Procedure: TOTAL LAPAROSCOPIC HYSTERECTOMY WITH SALPINGECTOMY;  Surgeon: Salvadore Dom, MD;  Location: [facility];  Service: Gynecology;  Laterality: Bilateral;  extended stay recovery  . WISDOM TOOTH EXTRACTION       Medications: Current Meds  Medication Sig  . Ascorbic Acid (VITAMIN C) 100 MG tablet Take 100 mg by mouth daily.  Marland Kitchen esomeprazole (NEXIUM) 40 MG capsule Take 40 mg by mouth as needed (gerd/reflux).  . fluticasone (FLONASE) 50 MCG/ACT nasal spray Place 1 spray into both nostrils 2 (two) times daily. (Patient taking differently: Place 1 spray into both nostrils as needed for rhinitis or allergies.)  . sucralfate (CARAFATE) 1 g tablet TAKE 1 TABLET (1 G TOTAL) BY MOUTH 4 (FOUR) TIMES DAILY - WITH MEALS AND AT BEDTIME. (Patient taking differently: Take 1 g by mouth as needed. stomach)  . [DISCONTINUED] esomeprazole (NEXIUM) 40 MG capsule Take 1 capsule (40 mg total) by mouth daily for 1 dose. (  Patient taking differently: Take 40 mg by mouth as needed (gerd/reflux).)     Allergies: No Known Allergies  Social History: The patient  reports that she has never smoked. She has never used smokeless tobacco. She reports current alcohol use of about 3.0 standard drinks of alcohol per week. She reports that she does not use drugs.   Family History: The patient's family history includes Colon cancer in her paternal grandmother; Fibroids in her mother; Heart disease in her father, mother, and sister; Hyperlipidemia in her father and mother; Hypertension in her father and mother.   Review of Systems: Please see the history of present illness.   All other systems are reviewed and negative.   Physical Exam: VS:  LMP  04/27/2018 (Exact Date)  .  BMI There is no height or weight on file to calculate BMI.  Wt Readings from Last 3 Encounters:  10/17/19 195 lb (88.5 kg)  09/24/19 194 lb (88 kg)  09/20/19 194 lb 3.2 oz (88.1 kg)    General: Pleasant. Alert and in no acute distress.   Cardiac: Regular rate and rhythm. No murmur that I can appreciate.  Respiratory:  Lungs are clear to auscultation bilaterally with normal work of breathing.  GI: Soft and nontender.  MS: No deformity or atrophy. Gait and ROM intact.  Skin: Warm and dry. Color is normal.  Neuro:  Strength and sensation are intact and no gross focal deficits noted.  Psych: Alert, appropriate and with normal affect.   LABORATORY DATA:  EKG:  EKG is ordered today.  Personally reviewed by me. This demonstrates NSR.  Lab Results  Component Value Date   WBC 11.1 (H) 09/20/2019   HGB 14.9 09/20/2019   HCT 43.1 09/20/2019   PLT 243.0 09/20/2019   GLUCOSE 97 09/20/2019   CHOL 180 01/02/2019   TRIG 84 01/02/2019   HDL 56 01/02/2019   LDLCALC 107 (H) 01/02/2019   ALT 16 09/20/2019   AST 17 09/20/2019   NA 140 09/20/2019   K 3.9 09/20/2019   CL 103 09/20/2019   CREATININE 1.36 (H) 09/20/2019   BUN 15 09/20/2019   CO2 29 09/20/2019   TSH 3.100 01/02/2019   HGBA1C 4.7 (L) 01/02/2019     BNP (last 3 results) No results for input(s): BNP in the last 8760 hours.  ProBNP (last 3 results) No results for input(s): PROBNP in the last 8760 hours.   Other Studies Reviewed Today:  ECHO IMPRESSIONS 06/2019  1. Left ventricular ejection fraction, by visual estimation, is 60 to  65%. The left ventricle has normal function. There is no left ventricular  hypertrophy.  2. The left ventricle has no regional wall motion abnormalities.  3. Evidence of atrial level shunting detected by color flow  Doppler.(Interatrial septum aneurysm with shunting evident on color  doppler). Documented on previous study.       ASSESSMENT & PLAN:      ASD/PFO with atrial septal aneurysm with right to left shunting - she has been asymptomatic - aspirin was recommended following her bubble study - would recommend starting. Last echo from 06/2019 - consider updating on return.   She would like to transition over to the [facility] once cardiology is there - this is much closer to her home and she continues to work remotely.   Current medicines are reviewed with the patient today.  The patient does not have concerns regarding medicines other than what has been noted above.  The following changes have been made:  See above.  Labs/ tests ordered today include:    Orders Placed This Encounter  Procedures  . EKG 12-Lead     Disposition:   FU in 6 months. She would like to transition to the [place] office. Aspirin recommended with more aerobic activity.     Patient is agreeable to this plan and will call if any problems develop in the interim.   SignedTruitt Merle, NP  07/08/2020 3:57 PM  [facility] [address] [place], [place]  [address] Phone: [phone] Fax: [phone]

## 2020-07-08 ENCOUNTER — Ambulatory Visit (INDEPENDENT_AMBULATORY_CARE_PROVIDER_SITE_OTHER): Payer: No Typology Code available for payment source | Admitting: Nurse Practitioner

## 2020-07-08 ENCOUNTER — Encounter: Payer: Self-pay | Admitting: Nurse Practitioner

## 2020-07-08 ENCOUNTER — Other Ambulatory Visit: Payer: Self-pay

## 2020-07-08 VITALS — BP 110/80 | HR 60 | Ht 60.0 in | Wt 202.0 lb

## 2020-07-08 DIAGNOSIS — Q211 Atrial septal defect, unspecified: Secondary | ICD-10-CM

## 2020-07-08 DIAGNOSIS — Q2112 Patent foramen ovale: Secondary | ICD-10-CM

## 2020-07-08 NOTE — Patient Instructions (Addendum)
After Visit Summary:  We will be checking the following labs today - NONE   Medication Instructions:    Continue with your current medicines.  Aspirin daily    If you need a refill on your cardiac medications before your next appointment, please call your pharmacy.     Testing/Procedures To Be Arranged:  N/A  Follow-Up:   See Dr. Geraldo Pitter in 6 months - ok to go to the [place] location    At [facility], you and your health needs are our priority.  As part of our continuing mission to provide you with exceptional heart care, we have created designated Provider Care Teams.  These Care Teams include your primary Cardiologist (physician) and Advanced Practice Providers (APPs -  Physician Assistants and Nurse Practitioners) who all work together to provide you with the care you need, when you need it.  Special Instructions:  . Stay safe, wash your hands for at least 20 seconds and wear a mask when needed.  . It was good to talk with you today.    Call the [place] office at [phone] if you have any questions, problems or concerns.

## 2020-09-07 ENCOUNTER — Telehealth: Payer: Self-pay

## 2020-09-07 NOTE — Telephone Encounter (Signed)
Pt is requesting a transfer of care from Dr. Betty Martinique to Lujean Rave, [place].  Please advise if this is okay

## 2020-09-07 NOTE — Telephone Encounter (Signed)
It is ok to transfer care. Thanks

## 2020-09-07 NOTE — Telephone Encounter (Signed)
I can probably accept, but please note I will not be able to see until this Fall.

## 2021-01-07 ENCOUNTER — Ambulatory Visit: Payer: No Typology Code available for payment source | Admitting: Physician Assistant

## 2021-01-29 ENCOUNTER — Encounter: Payer: Self-pay | Admitting: Physician Assistant

## 2021-01-29 ENCOUNTER — Other Ambulatory Visit: Payer: Self-pay

## 2021-01-29 ENCOUNTER — Ambulatory Visit (INDEPENDENT_AMBULATORY_CARE_PROVIDER_SITE_OTHER): Payer: No Typology Code available for payment source | Admitting: Physician Assistant

## 2021-01-29 VITALS — BP 109/74 | HR 55 | Temp 98.6°F | Ht 60.0 in | Wt 200.0 lb

## 2021-01-29 DIAGNOSIS — Z7689 Persons encountering health services in other specified circumstances: Secondary | ICD-10-CM | POA: Diagnosis not present

## 2021-01-29 DIAGNOSIS — K5730 Diverticulosis of large intestine without perforation or abscess without bleeding: Secondary | ICD-10-CM

## 2021-01-29 NOTE — Patient Instructions (Signed)
Great to meet you today!  Keep up the great work with your fitness. Call if any concerns.

## 2021-01-29 NOTE — Progress Notes (Signed)
New Patient Office Visit  Subjective:  Patient ID: Gabrielle Gilbert, female    DOB: 06/27/1975  Age: 45 y.o. MRN: [id number]  CC:  Chief Complaint  Patient presents with   Establish Care    HPI Gabrielle Gilbert presents for new patient establishment. She does not have any concerns today.   Some PMH includes:  Sees GI - diverticulitis in 2018 and 2020. Endoscopy for epigastric pain was negative, but takes Nexium.  She enjoys Crossfit 4-6 times per week and also goes to [employer]   Past Medical History:  Diagnosis Date   Abnormal Pap smear    Diverticulitis    Dysmenorrhea    Endometriosis    Fibroid    Gallstones    Heart murmur    History of fainting spells of unknown cause    Hormone disorder    Infertility, female    Migraine with aura    Rosacea     Past Surgical History:  Procedure Laterality Date   CERVICAL BIOPSY  W/ LOOP ELECTRODE EXCISION     CESAREAN SECTION  12/02/2011   Procedure: CESAREAN SECTION;  Surgeon: Marvene Staff, MD;  Location: [facility];  Service: Gynecology;  Laterality: N/A;   CHOLECYSTECTOMY     CYSTOSCOPY N/A 05/07/2018   Procedure: CYSTOSCOPY;  Surgeon: Salvadore Dom, MD;  Location: [facility];  Service: Gynecology;  Laterality: N/A;   DILATION AND CURETTAGE OF UTERUS     history of leep     1998   HYSTEROSCOPY     LAPAROSCOPIC OVARIAN CYSTECTOMY     LAPAROSCOPY     LEEP     MYOMECTOMY     robotic myomectomy, Dr Kerin Perna    TOTAL LAPAROSCOPIC HYSTERECTOMY WITH SALPINGECTOMY Bilateral 05/07/2018   Procedure: TOTAL LAPAROSCOPIC HYSTERECTOMY WITH SALPINGECTOMY;  Surgeon: Salvadore Dom, MD;  Location: [facility];  Service: Gynecology;  Laterality: Bilateral;  extended stay recovery   WISDOM TOOTH EXTRACTION      Family History  Problem Relation Age of Onset   Hyperlipidemia Mother    Heart disease Mother    Hypertension Mother    Fibroids Mother    Hyperlipidemia Father     Heart disease Father    Hypertension Father    Heart disease Sister    Colon cancer Paternal Grandmother    Colon cancer Maternal Uncle    Anesthesia problems Neg Hx    Esophageal cancer Neg Hx    Stomach cancer Neg Hx    Rectal cancer Neg Hx     Social History   Socioeconomic History   Marital status: Married    Spouse name: Not on file   Number of children: Not on file   Years of education: Not on file   Highest education level: Not on file  Occupational History   Not on file  Tobacco Use   Smoking status: Never   Smokeless tobacco: Never  Vaping Use   Vaping Use: Never used  Substance and Sexual Activity   Alcohol use: Yes    Alcohol/week: 3.0 standard drinks    Types: 3 Standard drinks or equivalent per week   Drug use: No   Sexual activity: Yes    Birth control/protection: Surgical    Comment: hysterecomy  Other Topics Concern   Not on file  Social History Narrative   Not on file   Social Determinants of Health   Financial Resource Strain: Not on file  Food Insecurity: Not  on file  Transportation Needs: Not on file  Physical Activity: Not on file  Stress: Not on file  Social Connections: Not on file  Intimate Partner Violence: Not on file    ROS Review of Systems  Constitutional:  Negative for activity change, appetite change, fever and unexpected weight change.  HENT:  Negative for congestion.   Eyes:  Negative for visual disturbance.  Respiratory:  Negative for apnea, cough and shortness of breath.   Cardiovascular:  Negative for chest pain, palpitations and leg swelling.  Gastrointestinal:  Negative for abdominal pain, blood in stool, constipation and diarrhea.  Endocrine: Negative for polydipsia, polyphagia and polyuria.  Genitourinary:  Negative for dysuria and pelvic pain.  Musculoskeletal:  Negative for arthralgias.  Skin:  Negative for rash.  Neurological:  Negative for dizziness, weakness and headaches.  Hematological:  Negative for  adenopathy. Does not bruise/bleed easily.  Psychiatric/Behavioral:  Negative for sleep disturbance and suicidal ideas. The patient is not nervous/anxious.    Objective:   Today's Vitals: BP 109/74   Pulse (!) 55   Temp 98.6 F (37 C) (Temporal)   Ht 5' (1.524 m)   Wt 200 lb (90.7 kg)   LMP 04/27/2018 (Exact Date)   SpO2 97%   BMI 39.06 kg/m   Physical Exam Vitals and nursing note reviewed.  Constitutional:      Appearance: Normal appearance. She is obese. She is not toxic-appearing.  HENT:     Head: Normocephalic and atraumatic.     Right Ear: Tympanic membrane, ear canal and external ear normal.     Left Ear: Tympanic membrane, ear canal and external ear normal.     Nose: Nose normal.     Mouth/Throat:     Mouth: Mucous membranes are moist.  Eyes:     Extraocular Movements: Extraocular movements intact.     Conjunctiva/sclera: Conjunctivae normal.     Pupils: Pupils are equal, round, and reactive to light.  Cardiovascular:     Rate and Rhythm: Normal rate and regular rhythm.     Pulses: Normal pulses.     Heart sounds: Normal heart sounds.  Pulmonary:     Effort: Pulmonary effort is normal.     Breath sounds: Normal breath sounds.  Abdominal:     General: Abdomen is flat. Bowel sounds are normal.     Palpations: Abdomen is soft.  Musculoskeletal:        General: Normal range of motion.     Cervical back: Normal range of motion and neck supple.  Skin:    General: Skin is warm and dry.  Neurological:     General: No focal deficit present.     Mental Status: She is alert and oriented to person, place, and time.  Psychiatric:        Mood and Affect: Mood normal.        Behavior: Behavior normal.        Thought Content: Thought content normal.        Judgment: Judgment normal.    Assessment & Plan:   Problem List Items Addressed This Visit       Digestive   Diverticula of colon   Other Visit Diagnoses     Encounter to establish care    -  Primary        Outpatient Encounter Medications as of 01/29/2021  Medication Sig   Ascorbic Acid (VITAMIN C) 100 MG tablet Take 100 mg by mouth daily.   esomeprazole (NEXIUM) 40 MG capsule  Take 40 mg by mouth as needed (gerd/reflux).   [DISCONTINUED] fluticasone (FLONASE) 50 MCG/ACT nasal spray Place 1 spray into both nostrils 2 (two) times daily. (Patient taking differently: Place 1 spray into both nostrils as needed for rhinitis or allergies.)   [DISCONTINUED] sucralfate (CARAFATE) 1 g tablet TAKE 1 TABLET (1 G TOTAL) BY MOUTH 4 (FOUR) TIMES DAILY - WITH MEALS AND AT BEDTIME. (Patient taking differently: Take 1 g by mouth as needed. stomach)   No facility-administered encounter medications on file as of 01/29/2021.    Follow-up: Return in about 6 months (around 08/01/2021) for Fasting labs and CPE  .   1. Encounter to establish care Pleasant 45 year old female here to establish care. She will schedule for fasting labs and CPE with me. She is doing well with exercise and continues to work on weight loss and nutrition.  2. Diverticula of colon Two prior bouts of diverticulitis. No recent issues. Sees GI as needed.    Diannia Hogenson M Agnieszka Newhouse, PA-C

## 2021-03-05 ENCOUNTER — Telehealth: Payer: Self-pay | Admitting: Family

## 2021-03-05 MED ORDER — FLUCONAZOLE 150 MG PO TABS: 150.0000 mg | ORAL_TABLET | Freq: Every day | ORAL | 0 refills | Status: DC

## 2021-03-05 NOTE — Telephone Encounter (Signed)
Diflucan 150 mg daily for 3 days, # 3 with no RF's.RX for above e-scribed and sent to pharmacy on record  [id number] - [place], [place] - [address] [place] [address] Phone: [phone] Fax: [phone]

## 2021-03-08 ENCOUNTER — Other Ambulatory Visit: Payer: Self-pay

## 2021-03-08 ENCOUNTER — Encounter: Payer: Self-pay | Admitting: Obstetrics and Gynecology

## 2021-03-08 ENCOUNTER — Ambulatory Visit (INDEPENDENT_AMBULATORY_CARE_PROVIDER_SITE_OTHER): Payer: No Typology Code available for payment source | Admitting: Obstetrics and Gynecology

## 2021-03-08 VITALS — BP 120/80 | HR 66 | Ht 60.0 in | Wt 197.0 lb

## 2021-03-08 DIAGNOSIS — N760 Acute vaginitis: Secondary | ICD-10-CM | POA: Diagnosis not present

## 2021-03-08 DIAGNOSIS — Z113 Encounter for screening for infections with a predominantly sexual mode of transmission: Secondary | ICD-10-CM | POA: Diagnosis not present

## 2021-03-08 LAB — WET PREP FOR TRICH, YEAST, CLUE

## 2021-03-08 NOTE — Progress Notes (Signed)
GYNECOLOGY  VISIT   HPI: 45 y.o.   Married White or Caucasian Not Hispanic or Latino  female   [phone] with Patient's last menstrual period was 04/27/2018 (exact date).   here for  she did a tela visit and was given diflucan. Patient states that she has also used monistat 3 day. She says that the monistat burned. She started having vaginal itching on Thursday. She started monistat 3 on Thursday. Did the above visit on Friday and started diflucan. She washed out the monistat on Saturday night secondary to severe burning. Some help with the burning after washing out the monistat.  Currently she has mild itching, no discharge, no odor.  In a monogamous relationship, would like to have STD testing just to be safe.   No bladder/bowel c/o.  GYNECOLOGIC HISTORY: Patient's last menstrual period was 04/27/2018 (exact date). Contraception:hysterectomy  Menopausal hormone therapy: none         OB History     Gravida  5   Para  2   Term  2   Preterm  0   AB  3   Living  2      SAB  3   IAB  0   Ectopic  0   Multiple  0   Live Births  1              Patient Active Problem List   Diagnosis Date Noted   Patent foramen ovale with atrial septal aneurysm 06/24/2019   Status post laparoscopic hysterectomy 05/07/2018   Migraine with aura    Diverticula of colon 03/01/2017   Class 2 obesity with body mass index (BMI) of 37.0 to 37.9 in adult 03/01/2017   Syncope 02/28/2017   Routine health maintenance 05/01/2012   Postpartum care following cesarean delivery (6/28) 12/02/2011    Past Medical History:  Diagnosis Date   Abnormal Pap smear    Diverticulitis    Dysmenorrhea    Endometriosis    Fibroid    Gallstones    Heart murmur    History of fainting spells of unknown cause    Hormone disorder    Infertility, female    Migraine with aura    Rosacea     Past Surgical History:  Procedure Laterality Date   CERVICAL BIOPSY  W/ LOOP ELECTRODE EXCISION     CESAREAN  SECTION  12/02/2011   Procedure: CESAREAN SECTION;  Surgeon: Marvene Staff, MD;  Location: [facility];  Service: Gynecology;  Laterality: N/A;   CHOLECYSTECTOMY     CYSTOSCOPY N/A 05/07/2018   Procedure: CYSTOSCOPY;  Surgeon: Salvadore Dom, MD;  Location: [facility];  Service: Gynecology;  Laterality: N/A;   DILATION AND CURETTAGE OF UTERUS     history of leep     1998   HYSTEROSCOPY     LAPAROSCOPIC OVARIAN CYSTECTOMY     LAPAROSCOPY     LEEP     MYOMECTOMY     robotic myomectomy, Dr Kerin Perna    TOTAL LAPAROSCOPIC HYSTERECTOMY WITH SALPINGECTOMY Bilateral 05/07/2018   Procedure: TOTAL LAPAROSCOPIC HYSTERECTOMY WITH SALPINGECTOMY;  Surgeon: Salvadore Dom, MD;  Location: [facility];  Service: Gynecology;  Laterality: Bilateral;  extended stay recovery   WISDOM TOOTH EXTRACTION      Current Outpatient Medications  Medication Sig Dispense Refill   Ascorbic Acid (VITAMIN C) 100 MG tablet Take 100 mg by mouth daily.     esomeprazole (NEXIUM) 40 MG capsule Take 40 mg by  mouth as needed (gerd/reflux).     fluconazole (DIFLUCAN) 150 MG tablet Take 1 tablet (150 mg total) by mouth daily. 3 tablet 0   No current facility-administered medications for this visit.     ALLERGIES: Patient has no known allergies.  Family History  Problem Relation Age of Onset   Hyperlipidemia Mother    Heart disease Mother    Hypertension Mother    Fibroids Mother    Hyperlipidemia Father    Heart disease Father    Hypertension Father    Heart disease Sister    Colon cancer Paternal Grandmother    Colon cancer Maternal Uncle    Anesthesia problems Neg Hx    Esophageal cancer Neg Hx    Stomach cancer Neg Hx    Rectal cancer Neg Hx     Social History   Socioeconomic History   Marital status: Married    Spouse name: Not on file   Number of children: Not on file   Years of education: Not on file   Highest education level: Not on file  Occupational  History   Not on file  Tobacco Use   Smoking status: Never   Smokeless tobacco: Never  Vaping Use   Vaping Use: Never used  Substance and Sexual Activity   Alcohol use: Yes    Alcohol/week: 3.0 standard drinks    Types: 3 Standard drinks or equivalent per week   Drug use: No   Sexual activity: Yes    Birth control/protection: Surgical    Comment: hysterecomy  Other Topics Concern   Not on file  Social History Narrative   Not on file   Social Determinants of Health   Financial Resource Strain: Not on file  Food Insecurity: Not on file  Transportation Needs: Not on file  Physical Activity: Not on file  Stress: Not on file  Social Connections: Not on file  Intimate Partner Violence: Not on file    Review of Systems  Genitourinary:        Vaginal burning   All other systems reviewed and are negative.  PHYSICAL EXAMINATION:    LMP 04/27/2018 (Exact Date)     General appearance: alert, cooperative and appears stated age  Pelvic: External genitalia:  no lesions              Urethra:  normal appearing urethra with no masses, tenderness or lesions              Bartholins and Skenes: normal                 Vagina: erythematous appearing vagina with a slight increase in yellow, watery, frothy vaginal d/c              Cervix: absent               Chaperone was present for exam.  1. Acute vaginitis No help with treatment for yeast - WET PREP FOR TRICH, YEAST, CLUE: negative   2. Screening examination for STD (sexually transmitted disease) - RPR - HIV Antibody (routine testing w rflx) - Hepatitis C antibody - SureSwab Advanced Vaginitis Plus,TMA

## 2021-03-08 NOTE — Patient Instructions (Signed)

## 2021-03-10 ENCOUNTER — Other Ambulatory Visit: Payer: Self-pay | Admitting: *Deleted

## 2021-03-10 LAB — SURESWAB® ADVANCED VAGINITIS PLUS,TMA
C. trachomatis RNA, TMA: NOT DETECTED
CANDIDA SPECIES: NOT DETECTED
Candida glabrata: NOT DETECTED
N. gonorrhoeae RNA, TMA: NOT DETECTED
SURESWAB(R) ADV BACTERIAL VAGINOSIS(BV),TMA: POSITIVE — AB
TRICHOMONAS VAGINALIS (TV),TMA: NOT DETECTED

## 2021-03-10 MED ORDER — METRONIDAZOLE 0.75 % VA GEL: 1.0000 | Freq: Every day | VAGINAL | 0 refills | Status: DC

## 2021-03-29 ENCOUNTER — Other Ambulatory Visit: Payer: Self-pay

## 2021-03-29 ENCOUNTER — Ambulatory Visit (INDEPENDENT_AMBULATORY_CARE_PROVIDER_SITE_OTHER): Payer: No Typology Code available for payment source | Admitting: Physician Assistant

## 2021-03-29 ENCOUNTER — Encounter: Payer: Self-pay | Admitting: Physician Assistant

## 2021-03-29 VITALS — BP 118/72 | HR 54 | Temp 97.8°F | Ht 60.0 in | Wt 201.2 lb

## 2021-03-29 DIAGNOSIS — Z23 Encounter for immunization: Secondary | ICD-10-CM

## 2021-03-29 DIAGNOSIS — E661 Drug-induced obesity: Secondary | ICD-10-CM

## 2021-03-29 DIAGNOSIS — E7800 Pure hypercholesterolemia, unspecified: Secondary | ICD-10-CM | POA: Diagnosis not present

## 2021-03-29 DIAGNOSIS — Z6839 Body mass index (BMI) 39.0-39.9, adult: Secondary | ICD-10-CM

## 2021-03-29 DIAGNOSIS — R638 Other symptoms and signs concerning food and fluid intake: Secondary | ICD-10-CM | POA: Diagnosis not present

## 2021-03-29 DIAGNOSIS — Z90710 Acquired absence of both cervix and uterus: Secondary | ICD-10-CM | POA: Diagnosis not present

## 2021-03-29 DIAGNOSIS — Z131 Encounter for screening for diabetes mellitus: Secondary | ICD-10-CM | POA: Diagnosis not present

## 2021-03-29 LAB — COMPREHENSIVE METABOLIC PANEL
ALT: 12 U/L (ref 0–35)
AST: 13 U/L (ref 0–37)
Albumin: 4.2 g/dL (ref 3.5–5.2)
Alkaline Phosphatase: 58 U/L (ref 39–117)
BUN: 18 mg/dL (ref 6–23)
CO2: 25 mEq/L (ref 19–32)
Calcium: 9.2 mg/dL (ref 8.4–10.5)
Chloride: 105 mEq/L (ref 96–112)
Creatinine, Ser: 1 mg/dL (ref 0.40–1.20)
GFR: 69.24 mL/min (ref >60.00)
Glucose, Bld: 93 mg/dL (ref 70–99)
Potassium: 4.1 mEq/L (ref 3.5–5.1)
Sodium: 138 mEq/L (ref 135–145)
Total Bilirubin: 0.7 mg/dL (ref 0.2–1.2)
Total Protein: 6.8 g/dL (ref 6.0–8.3)

## 2021-03-29 LAB — CBC WITH DIFFERENTIAL/PLATELET
Basophils Absolute: 0 10*3/uL (ref 0.0–0.1)
Basophils Relative: 0.4 % (ref 0.0–3.0)
Eosinophils Absolute: 0.1 10*3/uL (ref 0.0–0.7)
Eosinophils Relative: 1.7 % (ref 0.0–5.0)
HCT: 45.3 % (ref 36.0–46.0)
Hemoglobin: 15 g/dL (ref 12.0–15.0)
Lymphocytes Relative: 29.7 % (ref 12.0–46.0)
Lymphs Abs: 2.2 10*3/uL (ref 0.7–4.0)
MCHC: 33.2 g/dL (ref 30.0–36.0)
MCV: 89.1 fl (ref 78.0–100.0)
Monocytes Absolute: 0.6 10*3/uL (ref 0.1–1.0)
Monocytes Relative: 7.8 % (ref 3.0–12.0)
Neutro Abs: 4.5 10*3/uL (ref 1.4–7.7)
Neutrophils Relative %: 60.4 % (ref 43.0–77.0)
Platelets: 213 10*3/uL (ref 150.0–400.0)
RBC: 5.08 Mil/uL (ref 3.87–5.11)
RDW: 13.1 % (ref 11.5–15.5)
WBC: 7.4 10*3/uL (ref 4.0–10.5)

## 2021-03-29 LAB — LIPID PANEL
Cholesterol: 220 mg/dL — ABNORMAL HIGH (ref 0–200)
HDL: 70.9 mg/dL (ref >39.00)
LDL Cholesterol: 129 mg/dL — ABNORMAL HIGH (ref 0–99)
NonHDL: 148.61
Total CHOL/HDL Ratio: 3
Triglycerides: 98 mg/dL (ref 0.0–149.0)
VLDL: 19.6 mg/dL (ref 0.0–40.0)

## 2021-03-29 LAB — T4, FREE: Free T4: 0.84 ng/dL (ref 0.60–1.60)

## 2021-03-29 LAB — HEMOGLOBIN A1C: Hgb A1c MFr Bld: 5 % (ref 4.6–6.5)

## 2021-03-29 LAB — FOLLICLE STIMULATING HORMONE: FSH: 4.3 m[IU]/mL

## 2021-03-29 LAB — TSH: TSH: 3.4 u[IU]/mL (ref 0.35–5.50)

## 2021-03-29 NOTE — Progress Notes (Signed)
Subjective:    Patient ID: Gabrielle Gilbert, female    DOB: 02-08-1976, 45 y.o.   MRN: [id number]  Chief Complaint  Patient presents with   Obesity     HPI Patient is in today for labs and discussion about her weight. States that especially after having her hysterectomy in 2019 she has struggled with weight loss.  Oct 2020 - started Weight Watchers and did have success. She was down around 187 lbs at that time. Program changed and states it hasn't worked for her well since then.   "Lose It" app - started it with husband in the last few months & he has lost 13 lbs, and she hasn't lost at all.  She does Crossfit 4-6 days per week. Averages about 1600 calories per day. Tracks her meals. Cooks at home. Avoids extra calories from restaurants. Limits calories in her drinks - occasional beer on the weekends.   She would like labs done today to see if there are any changes or anything else she can do to try to lose.   Past Medical History:  Diagnosis Date   Abnormal Pap smear    Diverticulitis    Dysmenorrhea    Endometriosis    Fibroid    Gallstones    Heart murmur    History of fainting spells of unknown cause    Hormone disorder    Infertility, female    Migraine with aura    Rosacea     Past Surgical History:  Procedure Laterality Date   CERVICAL BIOPSY  W/ LOOP ELECTRODE EXCISION     CESAREAN SECTION  12/02/2011   Procedure: CESAREAN SECTION;  Surgeon: Marvene Staff, MD;  Location: [facility];  Service: Gynecology;  Laterality: N/A;   CHOLECYSTECTOMY     CYSTOSCOPY N/A 05/07/2018   Procedure: CYSTOSCOPY;  Surgeon: Salvadore Dom, MD;  Location: [facility];  Service: Gynecology;  Laterality: N/A;   DILATION AND CURETTAGE OF UTERUS     history of leep     1998   HYSTEROSCOPY     LAPAROSCOPIC OVARIAN CYSTECTOMY     LAPAROSCOPY     LEEP     MYOMECTOMY     robotic myomectomy, Dr Kerin Perna    TOTAL LAPAROSCOPIC HYSTERECTOMY WITH SALPINGECTOMY  Bilateral 05/07/2018   Procedure: TOTAL LAPAROSCOPIC HYSTERECTOMY WITH SALPINGECTOMY;  Surgeon: Salvadore Dom, MD;  Location: [facility];  Service: Gynecology;  Laterality: Bilateral;  extended stay recovery   WISDOM TOOTH EXTRACTION      Family History  Problem Relation Age of Onset   Hyperlipidemia Mother    Heart disease Mother    Hypertension Mother    Fibroids Mother    Hyperlipidemia Father    Heart disease Father    Hypertension Father    Heart disease Sister    Colon cancer Paternal Grandmother    Colon cancer Maternal Uncle    Anesthesia problems Neg Hx    Esophageal cancer Neg Hx    Stomach cancer Neg Hx    Rectal cancer Neg Hx     Social History   Tobacco Use   Smoking status: Never   Smokeless tobacco: Never  Vaping Use   Vaping Use: Never used  Substance Use Topics   Alcohol use: Yes    Alcohol/week: 3.0 standard drinks    Types: 3 Standard drinks or equivalent per week   Drug use: No     No Known Allergies  Review of Systems REFER  TO HPI FOR PERTINENT POSITIVES AND NEGATIVES      Objective:     BP 118/72   Pulse (!) 54   Temp 97.8 F (36.6 C)   Ht 5' (1.524 m)   Wt 201 lb 4 oz (91.3 kg)   LMP 04/27/2018 (Exact Date)   SpO2 98%   BMI 39.30 kg/m   Wt Readings from Last 3 Encounters:  03/29/21 201 lb 4 oz (91.3 kg)  03/08/21 197 lb (89.4 kg)  01/29/21 200 lb (90.7 kg)    BP Readings from Last 3 Encounters:  03/29/21 118/72  03/08/21 120/80  01/29/21 109/74     Physical Exam Vitals and nursing note reviewed.  Constitutional:      Appearance: Normal appearance. She is obese. She is not toxic-appearing.  HENT:     Head: Normocephalic and atraumatic.     Right Ear: Ear canal and external ear normal.     Left Ear: Ear canal and external ear normal.     Nose: Nose normal.     Mouth/Throat:     Mouth: Mucous membranes are moist.  Eyes:     Extraocular Movements: Extraocular movements intact.      Conjunctiva/sclera: Conjunctivae normal.     Pupils: Pupils are equal, round, and reactive to light.  Cardiovascular:     Rate and Rhythm: Normal rate and regular rhythm.     Pulses: Normal pulses.     Heart sounds: Normal heart sounds.  Pulmonary:     Effort: Pulmonary effort is normal.     Breath sounds: Normal breath sounds.  Abdominal:     General: Abdomen is flat. Bowel sounds are normal.     Palpations: Abdomen is soft.  Musculoskeletal:        General: Normal range of motion.     Cervical back: Normal range of motion and neck supple.  Skin:    General: Skin is warm and dry.  Neurological:     General: No focal deficit present.     Mental Status: She is alert and oriented to person, place, and time.  Psychiatric:        Mood and Affect: Mood normal.        Behavior: Behavior normal.        Thought Content: Thought content normal.        Judgment: Judgment normal.       Assessment & Plan:   Problem List Items Addressed This Visit       Other   Status post laparoscopic hysterectomy   Relevant Orders   FSH   Other Visit Diagnoses     Unable to lose weight    -  Primary   Relevant Orders   CBC with Differential/Platelet   Comprehensive metabolic panel   Lipid panel   TSH   Hemoglobin A1c   T4, free   Class 2 drug-induced obesity without serious comorbidity with body mass index (BMI) of 39.0 to 39.9 in adult       Relevant Orders   CBC with Differential/Platelet   Comprehensive metabolic panel   Lipid panel   TSH   Hemoglobin A1c   Diabetes mellitus screening       Relevant Orders   Comprehensive metabolic panel   Hemoglobin A1c   Elevated LDL cholesterol level       Relevant Orders   Lipid panel   Need for immunization against influenza       Relevant Orders   Flu Vaccine QUAD 36mo+IM (  Fluarix, Fluzone & Alfiuria Quad PF) (Completed)      -Encouraged patient that she is doing a great job maintaining her weight, even if she's not losing; she will  continue the Crossfit and calorie tracking -Labs as listed, will send results in [facility] referral   This note was prepared with assistance of Dragon voice recognition software. Occasional wrong-word or sound-a-like substitutions may have occurred due to the inherent limitations of voice recognition software.  Time Spent: 29 minutes of total time was spent on the date of the encounter performing the following actions: chart review prior to seeing the patient, obtaining history, performing a medically necessary exam, counseling on the treatment plan, placing orders, and documenting in our EHR.    Lorrayne Ismael M Glen Kesinger, PA-C

## 2021-03-29 NOTE — Patient Instructions (Signed)
Good to see you today!  Labs today, I will send results in MyChart. Keep up the good work! I know you are doing a great job with your fitness!   You can look into healthy weight and wellness as well. Let me know.

## 2021-04-05 ENCOUNTER — Other Ambulatory Visit: Payer: Self-pay

## 2021-04-05 DIAGNOSIS — Z113 Encounter for screening for infections with a predominantly sexual mode of transmission: Secondary | ICD-10-CM

## 2021-04-14 ENCOUNTER — Other Ambulatory Visit: Payer: Self-pay | Admitting: Physician Assistant

## 2021-04-14 DIAGNOSIS — Z1231 Encounter for screening mammogram for malignant neoplasm of breast: Secondary | ICD-10-CM

## 2021-04-16 ENCOUNTER — Ambulatory Visit
Admission: RE | Admit: 2021-04-16 | Discharge: 2021-04-16 | Disposition: A | Discharge status: Home / Self Care | Payer: No Typology Code available for payment source | Source: Ambulatory Visit | Attending: Physician Assistant | Admitting: Physician Assistant

## 2021-04-16 DIAGNOSIS — Z1231 Encounter for screening mammogram for malignant neoplasm of breast: Secondary | ICD-10-CM

## 2021-04-19 ENCOUNTER — Telehealth: Payer: Self-pay

## 2021-04-19 NOTE — Telephone Encounter (Signed)
Patient calling back about lab results.  °

## 2021-04-19 NOTE — Telephone Encounter (Signed)
Left detailed message on voicemail regarding results

## 2021-08-03 ENCOUNTER — Encounter: Payer: Self-pay | Admitting: Physician Assistant

## 2021-08-03 ENCOUNTER — Other Ambulatory Visit: Payer: Self-pay

## 2021-08-03 ENCOUNTER — Ambulatory Visit (INDEPENDENT_AMBULATORY_CARE_PROVIDER_SITE_OTHER): Payer: No Typology Code available for payment source | Admitting: Physician Assistant

## 2021-08-03 VITALS — BP 110/71 | HR 56 | Temp 98.2°F | Resp 16 | Ht 61.0 in | Wt 188.4 lb

## 2021-08-03 DIAGNOSIS — E782 Mixed hyperlipidemia: Secondary | ICD-10-CM

## 2021-08-03 DIAGNOSIS — Z0000 Encounter for general adult medical examination without abnormal findings: Secondary | ICD-10-CM

## 2021-08-03 LAB — COMPREHENSIVE METABOLIC PANEL
ALT: 13 U/L (ref 0–35)
AST: 16 U/L (ref 0–37)
Albumin: 4.3 g/dL (ref 3.5–5.2)
Alkaline Phosphatase: 47 U/L (ref 39–117)
BUN: 17 mg/dL (ref 6–23)
CO2: 27 mEq/L (ref 19–32)
Calcium: 9.2 mg/dL (ref 8.4–10.5)
Chloride: 105 mEq/L (ref 96–112)
Creatinine, Ser: 1.09 mg/dL (ref 0.40–1.20)
GFR: 62.29 mL/min (ref >60.00)
Glucose, Bld: 89 mg/dL (ref 70–99)
Potassium: 3.9 mEq/L (ref 3.5–5.1)
Sodium: 139 mEq/L (ref 135–145)
Total Bilirubin: 0.7 mg/dL (ref 0.2–1.2)
Total Protein: 7.1 g/dL (ref 6.0–8.3)

## 2021-08-03 LAB — CBC WITH DIFFERENTIAL/PLATELET
Basophils Absolute: 0 10*3/uL (ref 0.0–0.1)
Basophils Relative: 0.3 % (ref 0.0–3.0)
Eosinophils Absolute: 0.1 10*3/uL (ref 0.0–0.7)
Eosinophils Relative: 1.1 % (ref 0.0–5.0)
HCT: 45.1 % (ref 36.0–46.0)
Hemoglobin: 15 g/dL (ref 12.0–15.0)
Lymphocytes Relative: 21.9 % (ref 12.0–46.0)
Lymphs Abs: 2 10*3/uL (ref 0.7–4.0)
MCHC: 33.2 g/dL (ref 30.0–36.0)
MCV: 88 fl (ref 78.0–100.0)
Monocytes Absolute: 0.6 10*3/uL (ref 0.1–1.0)
Monocytes Relative: 6.2 % (ref 3.0–12.0)
Neutro Abs: 6.6 10*3/uL (ref 1.4–7.7)
Neutrophils Relative %: 70.5 % (ref 43.0–77.0)
Platelets: 200 10*3/uL (ref 150.0–400.0)
RBC: 5.12 Mil/uL — ABNORMAL HIGH (ref 3.87–5.11)
RDW: 13 % (ref 11.5–15.5)
WBC: 9.3 10*3/uL (ref 4.0–10.5)

## 2021-08-03 LAB — LIPID PANEL
Cholesterol: 162 mg/dL (ref 0–200)
HDL: 57 mg/dL (ref >39.00)
LDL Cholesterol: 87 mg/dL (ref 0–99)
NonHDL: 105.26
Total CHOL/HDL Ratio: 3
Triglycerides: 93 mg/dL (ref 0.0–149.0)
VLDL: 18.6 mg/dL (ref 0.0–40.0)

## 2021-08-03 NOTE — Progress Notes (Signed)
Subjective:    Patient ID: Gabrielle Gilbert, female    DOB: [birth year], 46 y.o.   MRN: [id number]  Chief Complaint  Patient presents with   Annual Exam    fasting    HPI Patient is in today for annual exam.  Acute concerns: None   Health maintenance: Lifestyle/ exercise: Crossfit & extra cardio 3-4 times per week Nutrition: Working with Hydrologist, has helped her with nutrition plan Mental health: Doing well Sleep: Doing well  Substance use: None Sexual activity: Active, monogamous  Immunizations: UTD Colonoscopy: Hx of diverticulitis - Will f/up with Dr. Rush [facility]mark  Pap: GYN - Dr. Talbert Nan  Mammogram: October, UTD, normal    Past Medical History:  Diagnosis Date   Abnormal Pap smear    Diverticulitis    Dysmenorrhea    Endometriosis    Fibroid    Gallstones    Heart murmur    History of fainting spells of unknown cause    Hormone disorder    Infertility, female    Migraine with aura    Rosacea     Past Surgical History:  Procedure Laterality Date   CERVICAL BIOPSY  W/ LOOP ELECTRODE EXCISION     CESAREAN SECTION  12/02/2011   Procedure: CESAREAN SECTION;  Surgeon: Marvene Staff, MD;  Location: [facility];  Service: Gynecology;  Laterality: N/A;   CHOLECYSTECTOMY     CYSTOSCOPY N/A 05/07/2018   Procedure: CYSTOSCOPY;  Surgeon: Salvadore Dom, MD;  Location: [facility];  Service: Gynecology;  Laterality: N/A;   DILATION AND CURETTAGE OF UTERUS     history of leep     1998   HYSTEROSCOPY     LAPAROSCOPIC OVARIAN CYSTECTOMY     LAPAROSCOPY     LEEP     MYOMECTOMY     robotic myomectomy, Dr Kerin Perna    TOTAL LAPAROSCOPIC HYSTERECTOMY WITH SALPINGECTOMY Bilateral 05/07/2018   Procedure: TOTAL LAPAROSCOPIC HYSTERECTOMY WITH SALPINGECTOMY;  Surgeon: Salvadore Dom, MD;  Location: [facility];  Service: Gynecology;  Laterality: Bilateral;  extended stay recovery   WISDOM TOOTH EXTRACTION       Family History  Problem Relation Age of Onset   Hyperlipidemia Mother    Heart disease Mother    Hypertension Mother    Fibroids Mother    Hyperlipidemia Father    Heart disease Father    Hypertension Father    Heart disease Sister    Colon cancer Paternal Grandmother    Colon cancer Maternal Uncle    Anesthesia problems Neg Hx    Esophageal cancer Neg Hx    Stomach cancer Neg Hx    Rectal cancer Neg Hx     Social History   Tobacco Use   Smoking status: Never   Smokeless tobacco: Never  Vaping Use   Vaping Use: Never used  Substance Use Topics   Alcohol use: Yes    Alcohol/week: 3.0 standard drinks    Types: 3 Standard drinks or equivalent per week   Drug use: No     No Known Allergies  Review of Systems NEGATIVE UNLESS OTHERWISE INDICATED IN HPI      Objective:     BP 110/71    Pulse (!) 56    Temp 98.2 F (36.8 C) (Temporal)    Resp 16    Ht 5\' 1"  (1.549 m)    Wt 188 lb 6.4 oz (85.5 kg)    LMP 04/27/2018 (Exact Date)  SpO2 99%    BMI 35.60 kg/m   Wt Readings from Last 3 Encounters:  08/03/21 188 lb 6.4 oz (85.5 kg)  03/29/21 201 lb 4 oz (91.3 kg)  03/08/21 197 lb (89.4 kg)    BP Readings from Last 3 Encounters:  08/03/21 110/71  03/29/21 118/72  03/08/21 120/80     Physical Exam Vitals and nursing note reviewed.  Constitutional:      Appearance: Normal appearance. She is normal weight. She is not toxic-appearing.  HENT:     Head: Normocephalic and atraumatic.     Right Ear: Tympanic membrane, ear canal and external ear normal.     Left Ear: Tympanic membrane, ear canal and external ear normal.     Nose: Nose normal.     Mouth/Throat:     Mouth: Mucous membranes are moist.  Eyes:     Extraocular Movements: Extraocular movements intact.     Conjunctiva/sclera: Conjunctivae normal.     Pupils: Pupils are equal, round, and reactive to light.  Neck:     Thyroid: No thyroid mass or thyromegaly.  Cardiovascular:     Rate and Rhythm:  Normal rate and regular rhythm.     Pulses: Normal pulses.     Heart sounds: Normal heart sounds.  Pulmonary:     Effort: Pulmonary effort is normal.     Breath sounds: Normal breath sounds.  Abdominal:     General: Abdomen is flat. Bowel sounds are normal.     Palpations: Abdomen is soft.  Musculoskeletal:        General: Normal range of motion.     Cervical back: Normal range of motion and neck supple.  Lymphadenopathy:     Cervical: No cervical adenopathy.     Upper Body:     Right upper body: No supraclavicular adenopathy.     Left upper body: No supraclavicular adenopathy.  Skin:    General: Skin is warm and dry.  Neurological:     General: No focal deficit present.     Mental Status: She is alert and oriented to person, place, and time.  Psychiatric:        Mood and Affect: Mood normal.        Behavior: Behavior normal.        Thought Content: Thought content normal.        Judgment: Judgment normal.       Assessment & Plan:   Problem List Items Addressed This Visit   None Visit Diagnoses     Encounter for annual physical exam    -  Primary   Relevant Orders   CBC with Differential/Platelet   Comprehensive metabolic panel   Lipid panel   Mixed hyperlipidemia       Relevant Orders   Lipid panel       Plan: -Age-appropriate screening and counseling performed today. Will check labs and call with results. Preventive measures discussed and printed in AVS for patient.  -Patient is up-to-date with vision and dental. - She will follow-up with her gynecologist for well woman exams. - She is not sure when she is due for her next colonoscopy but she will reach out to her GI doctor about this.  I tried to search her chart for pathology results from her last colonoscopy and did not see this report. -Congratulated her on her hard work with lifestyle changes and weight loss.   F/up in 6-12 months or prn.    This note was prepared with assistance of  Dragon Camera operator. Occasional wrong-word or sound-a-like substitutions may have occurred due to the inherent limitations of voice recognition software.   Destenee Guerry M Shyann Hefner, PA-C

## 2021-08-03 NOTE — Patient Instructions (Signed)
Good to see you! Please go to the lab for blood work and I will send results through [place].   Keep up the great work!!!  Call if any concerns.

## 2022-02-28 ENCOUNTER — Encounter: Payer: Self-pay | Admitting: *Deleted

## 2022-03-01 NOTE — Progress Notes (Signed)
46 y.o. [id number] Married White or Caucasian Not Hispanic or Latino female here for annual exam.  H/O TLH/BS in 12/19 for AUB, fibroids and dysmenorrhea.  No dyspareunia.   No bowel or bladder c/o.     Patient's last menstrual period was 04/27/2018 (exact date).          Sexually active: Yes.    The current method of family planning is status post hysterectomy.    Exercising: Yes.     Cross fit Smoker:  no  Health Maintenance: Pap:08/16/2017 WNL NEG HPV History of abnormal Pap:  yes, h/o LEEP years ago.  MMG:  04/16/21 density B Bi-rads 1 neg  BMD:   n/a Colonoscopy: 01/15/19 polyps f/u 5 years TDaP:  12/03/11 Gardasil: none   reports that she has never smoked. She has never used smokeless tobacco. She reports current alcohol use of about 3.0 standard drinks of alcohol per week. She reports that she does not use drugs. 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 Son is 27 and daughter is 16 Chief Technology Officer in [employer]).   Past Medical History:  Diagnosis Date   Abnormal Pap smear    Diverticulitis    Dysmenorrhea    Endometriosis    Fibroid    Gallstones    Heart murmur    History of fainting spells of unknown cause    Hormone disorder    Infertility, female    Migraine with aura    Rosacea     Past Surgical History:  Procedure Laterality Date   CERVICAL BIOPSY  W/ LOOP ELECTRODE EXCISION     CESAREAN SECTION  12/02/2011   Procedure: CESAREAN SECTION;  Surgeon: Marvene Staff, MD;  Location: [facility];  Service: Gynecology;  Laterality: N/A;   CHOLECYSTECTOMY     CYSTOSCOPY N/A 05/07/2018   Procedure: CYSTOSCOPY;  Surgeon: Salvadore Dom, MD;  Location: [facility];  Service: Gynecology;  Laterality: N/A;   DILATION AND CURETTAGE OF UTERUS     history of leep     1998   HYSTEROSCOPY     LAPAROSCOPIC OVARIAN CYSTECTOMY     LAPAROSCOPY     LEEP     MYOMECTOMY     robotic myomectomy, Dr Kerin Perna    TOTAL LAPAROSCOPIC  HYSTERECTOMY WITH SALPINGECTOMY Bilateral 05/07/2018   Procedure: TOTAL LAPAROSCOPIC HYSTERECTOMY WITH SALPINGECTOMY;  Surgeon: Salvadore Dom, MD;  Location: [facility];  Service: Gynecology;  Laterality: Bilateral;  extended stay recovery   WISDOM TOOTH EXTRACTION      Current Outpatient Medications  Medication Sig Dispense Refill   Ascorbic Acid (VITAMIN C) 100 MG tablet Take 100 mg by mouth daily.     aspirin 325 MG tablet Take 325 mg by mouth daily.     cetirizine (ZYRTEC) 10 MG tablet Take 10 mg by mouth daily.     Multiple Vitamin (MULTIVITAMIN) capsule Take 1 capsule by mouth daily.     Omega-3 Fatty Acids (FISH OIL PO) Take by mouth.     No current facility-administered medications for this visit.    Family History  Problem Relation Age of Onset   Hyperlipidemia Mother    Heart disease Mother    Hypertension Mother    Fibroids Mother    Hyperlipidemia Father    Heart disease Father    Hypertension Father    Heart disease Sister    Colon cancer Paternal Grandmother    Colon cancer Maternal Uncle  Anesthesia problems Neg Hx    Esophageal cancer Neg Hx    Stomach cancer Neg Hx    Rectal cancer Neg Hx     Review of Systems  All other systems reviewed and are negative.   Exam:   BP 124/72   Pulse 66   Ht 5' 0.75" (1.543 m)   Wt 193 lb (87.5 kg)   LMP 04/27/2018 (Exact Date) Comment: 04/30/18 urine preg.negative  SpO2 100%   BMI 36.77 kg/m   Weight change: '@WEIGHTCHANGE'$ @ Height:   Height: 5' 0.75" (154.3 cm)  Ht Readings from Last 3 Encounters:  03/07/22 5' 0.75" (1.543 m)  08/03/21 '5\' 1"'$  (1.549 m)  03/29/21 5' (1.524 m)    General appearance: alert, cooperative and appears stated age Head: Normocephalic, without obvious abnormality, atraumatic Neck: no adenopathy, supple, symmetrical, trachea midline and thyroid normal to inspection and palpation Lungs: clear to auscultation bilaterally Cardiovascular: regular rate and  rhythm Breasts: normal appearance, no masses or tenderness Abdomen: soft, non-tender; non distended,  no masses,  no organomegaly Extremities: extremities normal, atraumatic, no cyanosis or edema Skin: Skin color, texture, turgor normal. No rashes or lesions Lymph nodes: Cervical, supraclavicular, and axillary nodes normal. No abnormal inguinal nodes palpated Neurologic: Grossly normal   Pelvic: External genitalia:  no lesions              Urethra:  normal appearing urethra with no masses, tenderness or lesions              Bartholins and Skenes: normal                 Vagina: normal appearing vagina with normal color and discharge, no lesions              Cervix: absent               Bimanual Exam:  Uterus:  uterus absent              Adnexa: no mass, fullness, tenderness               Rectovaginal: Confirms               Anus:  normal sphincter tone, no lesions   1. Well woman exam Discussed breast self exam Discussed calcium and vit D intake Mammogram due in 11/23, she will schedule Colonoscopy is UTD  2. Screening for vaginal cancer - Cytology - PAP  3. Immunization due - Tdap vaccine greater than or equal to 7yo IM

## 2022-03-07 ENCOUNTER — Ambulatory Visit (INDEPENDENT_AMBULATORY_CARE_PROVIDER_SITE_OTHER): Payer: No Typology Code available for payment source | Admitting: Obstetrics and Gynecology

## 2022-03-07 ENCOUNTER — Other Ambulatory Visit (HOSPITAL_COMMUNITY)
Admission: RE | Admit: 2022-03-07 | Discharge: 2022-03-07 | Disposition: A | Discharge status: Home / Self Care | Payer: No Typology Code available for payment source | Source: Ambulatory Visit | Attending: Obstetrics and Gynecology | Admitting: Obstetrics and Gynecology

## 2022-03-07 ENCOUNTER — Encounter: Payer: Self-pay | Admitting: Obstetrics and Gynecology

## 2022-03-07 VITALS — BP 124/72 | HR 66 | Ht 60.75 in | Wt 193.0 lb

## 2022-03-07 DIAGNOSIS — Z23 Encounter for immunization: Secondary | ICD-10-CM

## 2022-03-07 DIAGNOSIS — Z1272 Encounter for screening for malignant neoplasm of vagina: Secondary | ICD-10-CM | POA: Insufficient documentation

## 2022-03-07 DIAGNOSIS — Z01419 Encounter for gynecological examination (general) (routine) without abnormal findings: Secondary | ICD-10-CM | POA: Diagnosis not present

## 2022-03-07 NOTE — Patient Instructions (Signed)

## 2022-03-08 LAB — CYTOLOGY - PAP
Comment: NEGATIVE
Diagnosis: NEGATIVE
High risk HPV: NEGATIVE

## 2022-05-04 ENCOUNTER — Other Ambulatory Visit: Payer: Self-pay | Admitting: Obstetrics and Gynecology

## 2022-05-04 DIAGNOSIS — Z1231 Encounter for screening mammogram for malignant neoplasm of breast: Secondary | ICD-10-CM

## 2022-05-13 ENCOUNTER — Ambulatory Visit
Admission: RE | Admit: 2022-05-13 | Discharge: 2022-05-13 | Disposition: A | Discharge status: Home / Self Care | Payer: No Typology Code available for payment source | Source: Ambulatory Visit | Attending: Obstetrics and Gynecology | Admitting: Obstetrics and Gynecology

## 2022-05-13 DIAGNOSIS — Z1231 Encounter for screening mammogram for malignant neoplasm of breast: Secondary | ICD-10-CM

## 2022-05-19 ENCOUNTER — Encounter: Payer: Self-pay | Admitting: *Deleted

## 2022-06-13 ENCOUNTER — Other Ambulatory Visit: Payer: Self-pay

## 2022-06-13 ENCOUNTER — Telehealth: Payer: Self-pay | Admitting: Nurse Practitioner

## 2022-06-13 ENCOUNTER — Emergency Department (HOSPITAL_COMMUNITY): Payer: No Typology Code available for payment source

## 2022-06-13 ENCOUNTER — Emergency Department (HOSPITAL_COMMUNITY)
Admission: EM | Admit: 2022-06-13 | Discharge: 2022-06-13 | Disposition: A | Discharge status: Home / Self Care | Payer: No Typology Code available for payment source | Attending: Emergency Medicine | Admitting: Emergency Medicine

## 2022-06-13 ENCOUNTER — Encounter (HOSPITAL_COMMUNITY): Payer: Self-pay

## 2022-06-13 DIAGNOSIS — Z7982 Long term (current) use of aspirin: Secondary | ICD-10-CM | POA: Diagnosis not present

## 2022-06-13 DIAGNOSIS — K5732 Diverticulitis of large intestine without perforation or abscess without bleeding: Secondary | ICD-10-CM

## 2022-06-13 DIAGNOSIS — R1032 Left lower quadrant pain: Secondary | ICD-10-CM | POA: Diagnosis present

## 2022-06-13 DIAGNOSIS — K5730 Diverticulosis of large intestine without perforation or abscess without bleeding: Secondary | ICD-10-CM | POA: Insufficient documentation

## 2022-06-13 LAB — URINALYSIS, ROUTINE W REFLEX MICROSCOPIC
Bilirubin Urine: NEGATIVE
Glucose, UA: NEGATIVE mg/dL
Hgb urine dipstick: NEGATIVE
Ketones, ur: NEGATIVE mg/dL
Leukocytes,Ua: NEGATIVE
Nitrite: NEGATIVE
Protein, ur: NEGATIVE mg/dL
Specific Gravity, Urine: 1.002 — ABNORMAL LOW (ref 1.005–1.030)
pH: 6 (ref 5.0–8.0)

## 2022-06-13 LAB — PREGNANCY, URINE: Preg Test, Ur: NEGATIVE

## 2022-06-13 LAB — COMPREHENSIVE METABOLIC PANEL
ALT: 13 U/L (ref 0–44)
AST: 13 U/L — ABNORMAL LOW (ref 15–41)
Albumin: 3.6 g/dL (ref 3.5–5.0)
Alkaline Phosphatase: 48 U/L (ref 38–126)
Anion gap: 7 (ref 5–15)
BUN: 10 mg/dL (ref 6–20)
CO2: 26 mmol/L (ref 22–32)
Calcium: 8.3 mg/dL — ABNORMAL LOW (ref 8.9–10.3)
Chloride: 104 mmol/L (ref 98–111)
Creatinine, Ser: 0.87 mg/dL (ref 0.44–1.00)
GFR, Estimated: >60 mL/min (ref >60)
Glucose, Bld: 98 mg/dL (ref 70–99)
Potassium: 4 mmol/L (ref 3.5–5.1)
Sodium: 137 mmol/L (ref 135–145)
Total Bilirubin: 0.4 mg/dL (ref 0.3–1.2)
Total Protein: 7 g/dL (ref 6.5–8.1)

## 2022-06-13 LAB — CBC
HCT: 41.9 % (ref 36.0–46.0)
Hemoglobin: 14 g/dL (ref 12.0–15.0)
MCH: 30 pg (ref 26.0–34.0)
MCHC: 33.4 g/dL (ref 30.0–36.0)
MCV: 89.7 fL (ref 80.0–100.0)
Platelets: 223 10*3/uL (ref 150–400)
RBC: 4.67 MIL/uL (ref 3.87–5.11)
RDW: 12.1 % (ref 11.5–15.5)
WBC: 7.6 10*3/uL (ref 4.0–10.5)
nRBC: 0 % (ref 0.0–0.2)

## 2022-06-13 LAB — LIPASE, BLOOD: Lipase: 31 U/L (ref 11–51)

## 2022-06-13 MED ORDER — AMOXICILLIN-POT CLAVULANATE 875-125 MG PO TABS: 1.0000 | ORAL_TABLET | Freq: Two times a day (BID) | ORAL | 0 refills | Status: AC

## 2022-06-13 MED ORDER — IOHEXOL 300 MG/ML  SOLN
100.0000 mL | Freq: Once | INTRAMUSCULAR | Status: AC | PRN
  Administered 2022-06-13: 100 mL via INTRAVENOUS

## 2022-06-13 MED ORDER — AMOXICILLIN-POT CLAVULANATE 875-125 MG PO TABS
1.0000 | ORAL_TABLET | Freq: Once | ORAL | Status: AC
  Administered 2022-06-13: 1 via ORAL
  Filled 2022-06-13: qty 1

## 2022-06-13 MED ORDER — HYDROCODONE-ACETAMINOPHEN 5-325 MG PO TABS: 1.0000 | ORAL_TABLET | ORAL | 0 refills | Status: DC | PRN

## 2022-06-13 NOTE — ED Notes (Signed)
Patient has a urine culture in the main lab 

## 2022-06-13 NOTE — ED Triage Notes (Signed)
Patient reports that she began having abdominal cramping and nausea x 5 days. Patient reports that she has a history of diverticulitis.

## 2022-06-13 NOTE — ED Provider Triage Note (Signed)
Emergency Medicine Provider Triage Evaluation Note  Gabrielle Gilbert , a 47 y.o. female  was evaluated in triage.  Pt complains of abdominal pain onset with left lower cramping, now into left side abdomen, unable to sleep due to pain. Denies changes in bladder habits. No blood in stools  Prior abdominal surgeries- hysterectomy, cholecystectomy, fibroids, LEEP, endometriosis, c-section  Review of Systems  Positive: As above Negative: As above, vomiting  Physical Exam  BP (!) 146/76 (BP Location: Left Arm)   Pulse (!) 54   Temp 97.7 F (36.5 C) (Oral)   Resp 16   Ht 5' 0.75" (1.543 m)   Wt 89.8 kg   LMP 04/27/2018 (Exact Date) Comment: 04/30/18 urine preg.negative  SpO2 100%   BMI 37.72 kg/m  Gen:   Awake, no distress   Resp:  Normal effort  MSK:   Moves extremities without difficulty  Other:    Medical Decision Making  Medically screening exam initiated at 11:42 AM.  Appropriate orders placed.  KALIE CABRAL was informed that the remainder of the evaluation will be completed by another provider, this initial triage assessment does not replace that evaluation, and the importance of remaining in the ED until their evaluation is complete.     Tacy Learn, PA-C 06/13/22 1146

## 2022-06-13 NOTE — ED Provider Notes (Signed)
Was handed off from Gabrielle Gilbert, [place].  Plan at time of handoff was to wait for CT abdomen results to rule out possible appendicitis, diverticulitis, or other abdominal etiology. Physical Exam  BP 124/80   Pulse 60   Temp 98.3 F (36.8 C) (Oral)   Resp 19   Ht 5' 0.75" (1.543 m)   Wt 89.8 kg   LMP 04/27/2018 (Exact Date) Comment: 04/30/18 urine preg.negative  SpO2 98%   BMI 37.72 kg/m   Physical Exam Vitals and nursing note reviewed.  Constitutional:      General: She is not in acute distress.    Appearance: She is well-developed. She is not ill-appearing.  HENT:     Head: Normocephalic and atraumatic.  Cardiovascular:     Rate and Rhythm: Normal rate and regular rhythm.  Pulmonary:     Effort: Pulmonary effort is normal.     Breath sounds: Normal breath sounds.  Abdominal:     General: Abdomen is flat. Bowel sounds are normal.     Palpations: Abdomen is soft.     Tenderness: There is abdominal tenderness in the left lower quadrant.  Skin:    General: Skin is warm and dry.     Capillary Refill: Capillary refill takes less than 2 seconds.  Neurological:     General: No focal deficit present.     Mental Status: She is alert.     Procedures  Procedures  ED Course / MDM   Clinical Course as of 06/13/22 Gabrielle Gilbert Jun 13, 2022  1731 CT ABDOMEN PELVIS W CONTRAST [OZ]    Clinical Course User Index [OZ] Luvenia Heller, PA-C   Medical Decision Making Amount and/or Complexity of Data Reviewed Labs: ordered. Radiology: ordered. Decision-making details documented in ED Course.  Risk Prescription drug management.   Patient was handed off from Gabrielle Gilbert, [place].  At time of handoff was to wait for CT scan results for further treatment and possible discharge or admission.  CT scan results showed nonperforated acute diverticulitis involving the descending colon. 1 dose of Augmentin was given here in the emergency department with a 7-day prescription sent to patient's  pharmacy.  Pain management was also sent for patient's pharmacy for short duration if needed.  Patient denies any further questions and was agreeable with plan to discharge home.  Advised patient she should follow-up with her primary care provider and her gastroenterologist for further evaluation if symptoms or not improving or worsening in the next few days.       Luvenia Heller, PA-C 06/13/22 1926    Drenda Freeze, MD 06/13/22 [phone]

## 2022-06-13 NOTE — Discharge Instructions (Addendum)
You were seen in the emergency department today for abdominal pain. Based on CT imaging, you currently have uncomplicated diverticulitis. A dose of Augmentin was given to you in the ED with a 7 day prescription sent to your pharmacy that you should take as directed in its entirety. Ensure that you are able to follow up with your gastroenterologist for further evaluation if needed particularly if your symptoms are not improving or worsening.

## 2022-06-13 NOTE — ED Notes (Signed)
To CT

## 2022-06-13 NOTE — ED Notes (Signed)
Up to b/r, steady gait, family at [facility], delay in IV. Pending CT.

## 2022-06-13 NOTE — Telephone Encounter (Signed)
Spoke with pt. Pt stated she is currently at the ED. Asked pt if she wanted to get scheduled for a follow up appointment with Tye Savoy, NP and pt stated she would call back to schedule appointment.

## 2022-06-13 NOTE — ED Notes (Addendum)
EDPA at [facility]. Pt alert, NAD, calm, interactive. C/o abd and back pain, L>R. Also some nausea, some loose stool and constipation. H/o diverticulitis. Denies bleeding, foreign travel, recent ABT. Denies h/o kidney stone, CP or sob. 1 BM in last 24 hrs. Scant L CVA tenderness.

## 2022-06-13 NOTE — ED Notes (Signed)
CT called about 1359 CT order. H/o hysterectomy. Reports CT backed up.

## 2022-06-13 NOTE — ED Notes (Signed)
Back from CT

## 2022-06-13 NOTE — ED Provider Notes (Signed)
[facility] DEPT Provider Note   CSN: [id number] Arrival date & time: 06/13/22  1120     History  Chief Complaint  Patient presents with   Abdominal Pain   Nausea   *Husband present to assist in history  Gabrielle Gilbert is a 47 y.o. female, s/p TAH-BSO 2018, h/o diverticulitis, presented today for 5d h/o LLQ pain.  Pain is 8/10 sharp constant that radiates epigastrically, around her waist, and straight to her back.  Pain feels similar to her diverticulitis previously.  Patient tried to get in with GI today but was told she would not be able to get in until 26th of January.  Patient has tried ibuprofen which is alleviated symptoms temporarily.  Palpation makes pain worse.  Pain is not relieved by defecation.   Patient denied fevers, chest pain, shortness of breath, dysuria, headaches, sick contacts, recent illnesses, changes in sensation/motor skills, hematuria, hematochezia.   Home Medications Prior to Admission medications   Medication Sig Start Date End Date Taking? Authorizing Provider  Ascorbic Acid (VITAMIN C) 100 MG tablet Take 100 mg by mouth daily.    [provider]  aspirin 325 MG tablet Take 325 mg by mouth daily.    [provider]  cetirizine (ZYRTEC) 10 MG tablet Take 10 mg by mouth daily.    [provider]  Multiple Vitamin (MULTIVITAMIN) capsule Take 1 capsule by mouth daily.    [provider]  Omega-3 Fatty Acids (FISH OIL PO) Take by mouth.    [provider]      Allergies    Patient has no known allergies.    Review of Systems   Review of Systems  Gastrointestinal:  Positive for abdominal pain.  See HPI  Physical Exam Updated Vital Signs BP 101/79   Pulse (!) 50   Temp 97.7 F (36.5 C) (Oral)   Resp 18   Ht 5' 0.75" (1.543 m)   Wt 89.8 kg   LMP 04/27/2018 (Exact Date) Comment: 04/30/18 urine preg.negative  SpO2 100%   BMI 37.72 kg/m  Physical Exam Constitutional:       General: She is not in acute distress. Eyes:     General: No scleral icterus.    Extraocular Movements: Extraocular movements intact.     Pupils: Pupils are equal, round, and reactive to light.  Cardiovascular:     Rate and Rhythm: Regular rhythm. Bradycardia present.     Heart sounds: Normal heart sounds. No murmur heard. Pulmonary:     Effort: Pulmonary effort is normal.     Breath sounds: Normal breath sounds.  Abdominal:     General: Abdomen is flat. Bowel sounds are normal.     Palpations: Abdomen is soft.     Tenderness: There is abdominal tenderness in the left upper quadrant. There is left CVA tenderness and guarding. There is no rebound.     Hernia: No hernia is present.  Skin:    General: Skin is warm and dry.     Capillary Refill: Capillary refill takes less than 2 seconds.  Neurological:     General: No focal deficit present.     Mental Status: She is alert and oriented to person, place, and time.  Psychiatric:        Mood and Affect: Mood normal.     ED Results / Procedures / Treatments   Labs (all labs ordered are listed, but only abnormal results are displayed) Labs Reviewed  COMPREHENSIVE METABOLIC PANEL - Abnormal;  Notable for the following components:      Result Value   Calcium 8.3 (*)    AST 13 (*)    All other components within normal limits  URINALYSIS, ROUTINE W REFLEX MICROSCOPIC - Abnormal; Notable for the following components:   Color, Urine STRAW (*)    Specific Gravity, Urine 1.002 (*)    All other components within normal limits  LIPASE, BLOOD  CBC    EKG None  Radiology No results found.  Procedures Procedures    Medications Ordered in ED Medications - No data to display  ED Course/ Medical Decision Making/ A&P                           Medical Decision Making Amount and/or Complexity of Data Reviewed Labs: ordered.   [name] 47 y.o. presented today for LLQ pain. Working DDx that I considered at this time includes,  but not limited to, diverticulitis, pancreatitis, pyelonephritis, nephrolithiasis.  Review of prior external notes: 01/22/17 ED Provider  Unique Tests and my interpretation: UA: Unremarkable Lipase: Unremarkable CBC: Unremarkable CMP: Unremarkable CT abdomen pelvis with contrast:  Discussion with Independent Historian: Husband  Discussion of Management of Tests: none  Risk: Cannot be determined at this time  Risk Stratification Score: none  Staffed with Suella Broad, PA-C  R/o DDx: Reubin Milan be determined at this time as we are still pending the CT  Plan: Patient's history of diverticulitis this, after some shared decision making we decided it was best to proceed with a CT to look for possible source of the abdominal pain.  Patient has been stable in ED and her labs are reassuring for any infections.  Pending CT patient may be discharged with antibiotics with GI follow-up. Patient was signed out to oncoming team at 1500.   Final Clinical Impression(s) / ED Diagnoses Final diagnoses:  None    Rx / DC Orders ED Discharge Orders     None         Elvina Sidle 06/13/22 1511    Valarie Merino, MD 06/16/22 [phone]

## 2022-06-13 NOTE — Telephone Encounter (Signed)
Patient called states she thinks she is having a Diverticulitis flare up has had lower abdominal\back pain. Was told whenever she has a flare up to call and get advised to possibly get medication sent in for her. Please advise.

## 2022-06-14 NOTE — Telephone Encounter (Signed)
Spoke with pt. Pt is scheduled to follow up with Alonza Bogus, PA on 06/21/22 at 1:30 pm. Pt went to ED yesterday and is taking augmentin and norco for diverticulitis. Pt states that ED said she was supposed to follow up in 3 days. Let pt know that 06/21/22 is our soonest appointment available but if she feels like the medication is not helping she should give [place] a call. Pt wanted to ask Nevin Bloodgood if it is okay if she follows up on 1/16 or if she should be seen sooner.

## 2022-06-14 NOTE — Telephone Encounter (Signed)
Patient called back to schedule appointment with Tye Savoy, NP. I offered the 19th of January for a follow up. Patient wanted to get seen today. Advised patient we don't schedule same day appointments and patient requested a nurse to call her.

## 2022-06-21 ENCOUNTER — Ambulatory Visit: Payer: No Typology Code available for payment source | Admitting: Gastroenterology

## 2022-06-21 ENCOUNTER — Encounter: Payer: Self-pay | Admitting: Gastroenterology

## 2022-06-21 VITALS — BP 112/70 | HR 65 | Ht 61.0 in | Wt 197.0 lb

## 2022-06-21 DIAGNOSIS — K5732 Diverticulitis of large intestine without perforation or abscess without bleeding: Secondary | ICD-10-CM | POA: Diagnosis not present

## 2022-06-21 MED ORDER — HYOSCYAMINE SULFATE 0.125 MG SL SUBL: 0.1250 mg | SUBLINGUAL_TABLET | Freq: Four times a day (QID) | SUBLINGUAL | 0 refills | Status: AC | PRN

## 2022-06-21 MED ORDER — CIPROFLOXACIN HCL 500 MG PO TABS: 500.0000 mg | ORAL_TABLET | Freq: Two times a day (BID) | ORAL | 0 refills | Status: DC

## 2022-06-21 MED ORDER — METRONIDAZOLE 500 MG PO TABS: 500.0000 mg | ORAL_TABLET | Freq: Three times a day (TID) | ORAL | 0 refills | Status: DC

## 2022-06-21 NOTE — Patient Instructions (Signed)
We have sent the following medications to your pharmacy for you to pick up at your convenience: Cipro 500 mg twice daily for 7 days. Flagyl 500 mg three times daily for 7 days. Levsin SL 0.125 mg every 6 hours as needed.  Call or send Mychart message with an update later this week.   _______________________________________________________  If your blood pressure at your visit was 140/90 or greater, please contact your primary care physician to follow up on this.  _______________________________________________________  If you are age 45 or older, your body mass index should be between 23-30. Your Body mass index is 37.22 kg/m. If this is out of the aforementioned range listed, please consider follow up with your Primary Care Provider.  If you are age 15 or younger, your body mass index should be between 19-25. Your Body mass index is 37.22 kg/m. If this is out of the aformentioned range listed, please consider follow up with your Primary Care Provider.   ________________________________________________________  The [facility] providers would like to encourage you to use [facility] to communicate with providers for non-urgent requests or questions.  Due to long hold times on the telephone, sending your provider a message by [facility] may be a faster and more efficient way to get a response.  Please allow 48 business hours for a response.  Please remember that this is for non-urgent requests.  _______________________________________________________

## 2022-06-21 NOTE — Progress Notes (Addendum)
06/21/2022 Gabrielle Gilbert [id number] 26-Feb-1976   HISTORY OF PRESENT ILLNESS: This is a 47 year old female who is a patient Dr. Donneta Romberg.  She is here today for an ER follow-up of diverticulitis.  She had an episode of diverticulitis in 2018 and then again in 2020.  Now on this occasion CT scan confirmed diverticulitis in the descending colon last week.  She completed course of Augmentin twice daily for 7 days just as of yesterday.  She says that she is feeling better, but still having pain.  Says that this time her symptoms started with the pain in different location than previous so she was not quite sure what to make of it initially.  Says that started across her abdomen very low then somewhat localized to the left mid-abdomen/left flank area.  Colonoscopy August 2020 showed diverticulosis in the rectosigmoid colon.  Also had hemorrhoids.  3 polyps were removed and were both tubular adenoma and hyperplastic on pathology. Recall 5 years.   Past Medical History:  Diagnosis Date   Abnormal Pap smear    Diverticulitis    Dysmenorrhea    Endometriosis    Fibroid    Gallstones    Heart murmur    History of fainting spells of unknown cause    Hormone disorder    Infertility, female    Migraine with aura    Rosacea    Past Surgical History:  Procedure Laterality Date   ABDOMINAL HYSTERECTOMY     CERVICAL BIOPSY  W/ LOOP ELECTRODE EXCISION     CESAREAN SECTION  12/02/2011   Procedure: CESAREAN SECTION;  Surgeon: Marvene Staff, MD;  Location: [facility];  Service: Gynecology;  Laterality: N/A;   CHOLECYSTECTOMY     CYSTOSCOPY N/A 05/07/2018   Procedure: CYSTOSCOPY;  Surgeon: Salvadore Dom, MD;  Location: [facility];  Service: Gynecology;  Laterality: N/A;   DILATION AND CURETTAGE OF UTERUS     history of leep     1998   HYSTEROSCOPY     LAPAROSCOPIC OVARIAN CYSTECTOMY     LAPAROSCOPY     LEEP     MYOMECTOMY     robotic myomectomy, Dr Kerin Perna     TOTAL LAPAROSCOPIC HYSTERECTOMY WITH SALPINGECTOMY Bilateral 05/07/2018   Procedure: TOTAL LAPAROSCOPIC HYSTERECTOMY WITH SALPINGECTOMY;  Surgeon: Salvadore Dom, MD;  Location: [facility];  Service: Gynecology;  Laterality: Bilateral;  extended stay recovery   WISDOM TOOTH EXTRACTION      reports that she has never smoked. She has never used smokeless tobacco. She reports current alcohol use of about 3.0 standard drinks of alcohol per week. She reports that she does not use drugs. family history includes Colon cancer in her maternal uncle and paternal grandmother; Fibroids in her mother; Heart disease in her father, mother, and sister; Hyperlipidemia in her father and mother; Hypertension in her father and mother. No Known Allergies    Outpatient Encounter Medications as of 06/21/2022  Medication Sig   Ascorbic Acid (VITAMIN C) 100 MG tablet Take 100 mg by mouth daily.   aspirin 325 MG tablet Take 325 mg by mouth daily.   cetirizine (ZYRTEC) 10 MG tablet Take 10 mg by mouth daily.   HYDROcodone-acetaminophen (NORCO/VICODIN) 5-325 MG tablet Take 1 tablet by mouth every 4 (four) hours as needed.   Multiple Vitamin (MULTIVITAMIN) capsule Take 1 capsule by mouth daily.   Omega-3 Fatty Acids (FISH OIL PO) Take by mouth.   No facility-administered encounter medications on  file as of 06/21/2022.     REVIEW OF SYSTEMS  : All other systems reviewed and negative except where noted in the History of Present Illness.   PHYSICAL EXAM: BP 112/70   Pulse 65   Ht '5\' 1"'$  (1.549 m)   Wt 197 lb (89.4 kg)   LMP 04/27/2018 (Exact Date) Comment: 04/30/18 urine preg.negative  BMI 37.22 kg/m  General: Well developed white female in no acute distress Head: Normocephalic and atraumatic Eyes:  Sclerae anicteric, conjunctiva pink. Ears: Normal auditory acuity Lungs: Clear throughout to auscultation; no W/R/R. Heart: Regular rate and rhythm; no M/R/G. Abdomen: Soft, non-distended.   BS present.  Mild left sided TTP. Musculoskeletal: Symmetrical with no gross deformities  Skin: No lesions on visible extremities Extremities: No edema  Neurological: Alert oriented x 4, grossly non-focal Psychological:  Alert and cooperative. Normal mood and affect  ASSESSMENT AND PLAN: *Diverticulitis: Seen in descending colon on CT scan.  This is her third episode, initially in 2018 and in 2020 and now.  She completed Augmentin twice daily for 7 days just yesterday.  Feeling better, but still having pain.  Will treat with a course of Cipro 500 mg twice daily for 7 days and Flagyl 500 mg 3 times daily for 7 days.  Will also give Levsin to use for cramping and spasming.  I have asked her to call or message [place] back at the end of the week with an update on her symptoms.  Continue low residue/low fiber diet for now.  Prescriptions sent to pharmacy.   CC:  Allwardt, Randa Evens, PA-C

## 2022-06-21 NOTE — Progress Notes (Signed)
Attending Physician's Attestation   I have reviewed the chart.   I agree with the Advanced Practitioner's note, impression, and recommendations with any updates as below.    Denna Fryberger Mansouraty, MD [facility] Office # [phone]  

## 2022-08-05 ENCOUNTER — Telehealth: Payer: Self-pay | Admitting: Cardiology

## 2022-08-05 NOTE — Telephone Encounter (Signed)
Patient would like to transfer from Dr. Geraldo Pitter to Dr. Oval Linsey at [facility] due to location

## 2022-08-19 ENCOUNTER — Encounter: Payer: Self-pay | Admitting: Physician Assistant

## 2022-08-19 ENCOUNTER — Ambulatory Visit: Payer: No Typology Code available for payment source | Admitting: Physician Assistant

## 2022-08-19 VITALS — BP 112/78 | HR 55 | Temp 97.8°F | Ht 61.0 in | Wt 189.4 lb

## 2022-08-19 DIAGNOSIS — L249 Irritant contact dermatitis, unspecified cause: Secondary | ICD-10-CM

## 2022-08-19 MED ORDER — TRIAMCINOLONE ACETONIDE 0.1 % EX CREA: 1.0000 | TOPICAL_CREAM | Freq: Two times a day (BID) | CUTANEOUS | 0 refills | Status: AC

## 2022-08-19 MED ORDER — METHYLPREDNISOLONE ACETATE 80 MG/ML IJ SUSP
80.0000 mg | Freq: Once | INTRAMUSCULAR | Status: AC
  Administered 2022-08-19: 80 mg via INTRAMUSCULAR

## 2022-08-19 NOTE — Addendum Note (Signed)
Addended by: Verlon Setting on: 08/19/2022 12:22 PM   Modules accepted: Orders

## 2022-08-19 NOTE — Progress Notes (Signed)
Subjective:    Patient ID: Gabrielle Gilbert, female    DOB: 1975/09/23, 47 y.o.   MRN: [id number]  Chief Complaint  Patient presents with   Rash    Pt c/o rash on right arm; pt worked in yard on Sunday, noticed small dot on right arm, rash has seemed to move and expand to other areas since. No burning little irritation at times; using OTC Calamine lotion. No new foods in diet or laundry soap bath soap changes.     HPI Patient is in today for rash on R arm as described in CC. No fever or chills. No other symptoms. She also had a new tattoo done about one week ago.   Past Medical History:  Diagnosis Date   Abnormal Pap smear    Diverticulitis    Dysmenorrhea    Endometriosis    Fibroid    Gallstones    Heart murmur    History of fainting spells of unknown cause    Hormone disorder    Infertility, female    Migraine with aura    Rosacea     Past Surgical History:  Procedure Laterality Date   ABDOMINAL HYSTERECTOMY     CERVICAL BIOPSY  W/ LOOP ELECTRODE EXCISION     CESAREAN SECTION  12/02/2011   Procedure: CESAREAN SECTION;  Surgeon: Sheronette A Cousins, MD;  Location: [facility];  Service: Gynecology;  Laterality: N/A;   CHOLECYSTECTOMY     CYSTOSCOPY N/A 05/07/2018   Procedure: CYSTOSCOPY;  Surgeon: Jertson, Jill Evelyn, MD;  Location: [facility];  Service: Gynecology;  Laterality: N/A;   DILATION AND CURETTAGE OF UTERUS     history of leep     [age]   [place]     LEEP     MYOMECTOMY     robotic myomectomy, Dr Kerin Perna    TOTAL LAPAROSCOPIC HYSTERECTOMY WITH SALPINGECTOMY Bilateral 05/07/2018   Procedure: TOTAL LAPAROSCOPIC HYSTERECTOMY WITH SALPINGECTOMY;  Surgeon: Salvadore Dom, MD;  Location: [facility];  Service: Gynecology;  Laterality: Bilateral;  extended stay recovery   WISDOM TOOTH EXTRACTION      Family History  Problem Relation Age of Onset   Hyperlipidemia  Mother    Heart disease Mother    Hypertension Mother    Fibroids Mother    Hyperlipidemia Father    Heart disease Father    Hypertension Father    Heart disease Sister    Colon cancer Paternal Grandmother    Colon cancer Maternal Uncle    Anesthesia problems Neg Hx    Esophageal cancer Neg Hx    Stomach cancer Neg Hx    Rectal cancer Neg Hx     Social History   Tobacco Use   Smoking status: Never   Smokeless tobacco: Never  Vaping Use   Vaping Use: Never used  Substance Use Topics   Alcohol use: Yes    Alcohol/week: 3.0 standard drinks of alcohol    Types: 3 Standard drinks or equivalent per week   Drug use: No     No Known Allergies  Review of Systems NEGATIVE UNLESS OTHERWISE INDICATED IN HPI      Objective:     BP 112/78 (BP Location: Left Arm)   Pulse (!) 55   Temp 97.8 F (36.6 C) (Temporal)   Ht 5\' 1"  (1.549 m)   Wt 189 lb 6.4 oz (85.9 kg)   LMP 04/27/2018 (  Exact Date) Comment: 04/30/18 urine preg.negative  SpO2 98%   BMI 35.79 kg/m   Wt Readings from Last 3 Encounters:  08/19/22 189 lb 6.4 oz (85.9 kg)  06/21/22 197 lb (89.4 kg)  06/13/22 198 lb (89.8 kg)    BP Readings from Last 3 Encounters:  08/19/22 112/78  06/21/22 112/70  06/13/22 106/68     Physical Exam Vitals and nursing note reviewed.  Constitutional:      Appearance: Normal appearance.  Cardiovascular:     Rate and Rhythm: Normal rate and regular rhythm.     Pulses: Normal pulses.  Pulmonary:     Effort: Pulmonary effort is normal.     Breath sounds: Normal breath sounds.  Skin:    Findings: Rash (right mid arm area of fine macular erythematous rash mostly anterior arm from mid forearm to mid upper arm) present.  Neurological:     Mental Status: She is alert.        Assessment & Plan:  Irritant contact dermatitis, unspecified trigger  Other orders -     Triamcinolone Acetonide; Apply 1 Application topically 2 (two) times daily. Do not use longer than 2 weeks  consistently.  Dispense: 45 g; Refill: 0    Rash consistent with irritant contact dermatitis.  Possibly related to her outdoor activity or her new tattoo, difficult to say at this time.  It is bothersome and itchy.  It seemed to be spreading.  Depo-Medrol 80 mg injection given in office today.  She may also use triamcinolone cream at home up to 2 weeks as needed.  Keep cool and hydrated.  Moisturize area well.  Recheck as needed.    This note was prepared with assistance of Systems analyst. Occasional wrong-word or sound-a-like substitutions may have occurred due to the inherent limitations of voice recognition software.    Roselle Norton M Jacky Hartung, PA-C

## 2022-08-22 ENCOUNTER — Ambulatory Visit (INDEPENDENT_AMBULATORY_CARE_PROVIDER_SITE_OTHER): Payer: No Typology Code available for payment source | Admitting: Physician Assistant

## 2022-08-22 ENCOUNTER — Ambulatory Visit (INDEPENDENT_AMBULATORY_CARE_PROVIDER_SITE_OTHER): Payer: No Typology Code available for payment source | Admitting: Cardiovascular Disease

## 2022-08-22 ENCOUNTER — Encounter: Payer: Self-pay | Admitting: Physician Assistant

## 2022-08-22 ENCOUNTER — Encounter (HOSPITAL_BASED_OUTPATIENT_CLINIC_OR_DEPARTMENT_OTHER): Payer: Self-pay | Admitting: Cardiovascular Disease

## 2022-08-22 VITALS — BP 124/74 | HR 64 | Temp 97.5°F | Ht 61.0 in | Wt 190.0 lb

## 2022-08-22 VITALS — BP 118/70 | HR 49 | Ht 61.0 in | Wt 193.7 lb

## 2022-08-22 DIAGNOSIS — R001 Bradycardia, unspecified: Secondary | ICD-10-CM

## 2022-08-22 DIAGNOSIS — Q2112 Patent foramen ovale: Secondary | ICD-10-CM | POA: Diagnosis not present

## 2022-08-22 DIAGNOSIS — E782 Mixed hyperlipidemia: Secondary | ICD-10-CM | POA: Diagnosis not present

## 2022-08-22 DIAGNOSIS — Z0000 Encounter for general adult medical examination without abnormal findings: Secondary | ICD-10-CM | POA: Diagnosis not present

## 2022-08-22 DIAGNOSIS — I253 Aneurysm of heart: Secondary | ICD-10-CM

## 2022-08-22 LAB — CBC WITH DIFFERENTIAL/PLATELET
Basophils Absolute: 0 10*3/uL (ref 0.0–0.1)
Basophils Relative: 0.4 % (ref 0.0–3.0)
Eosinophils Absolute: 0.1 10*3/uL (ref 0.0–0.7)
Eosinophils Relative: 1.5 % (ref 0.0–5.0)
HCT: 43.1 % (ref 36.0–46.0)
Hemoglobin: 14.6 g/dL (ref 12.0–15.0)
Lymphocytes Relative: 22.2 % (ref 12.0–46.0)
Lymphs Abs: 1.9 10*3/uL (ref 0.7–4.0)
MCHC: 33.9 g/dL (ref 30.0–36.0)
MCV: 86.9 fl (ref 78.0–100.0)
Monocytes Absolute: 0.5 10*3/uL (ref 0.1–1.0)
Monocytes Relative: 6.2 % (ref 3.0–12.0)
Neutro Abs: 6.1 10*3/uL (ref 1.4–7.7)
Neutrophils Relative %: 69.7 % (ref 43.0–77.0)
Platelets: 226 10*3/uL (ref 150.0–400.0)
RBC: 4.97 Mil/uL (ref 3.87–5.11)
RDW: 13.3 % (ref 11.5–15.5)
WBC: 8.7 10*3/uL (ref 4.0–10.5)

## 2022-08-22 LAB — COMPREHENSIVE METABOLIC PANEL
ALT: 11 U/L (ref 0–35)
AST: 13 U/L (ref 0–37)
Albumin: 3.9 g/dL (ref 3.5–5.2)
Alkaline Phosphatase: 49 U/L (ref 39–117)
BUN: 15 mg/dL (ref 6–23)
CO2: 25 mEq/L (ref 19–32)
Calcium: 9 mg/dL (ref 8.4–10.5)
Chloride: 107 mEq/L (ref 96–112)
Creatinine, Ser: 0.9 mg/dL (ref 0.40–1.20)
GFR: 77.8 mL/min (ref >60.00)
Glucose, Bld: 91 mg/dL (ref 70–99)
Potassium: 4.1 mEq/L (ref 3.5–5.1)
Sodium: 141 mEq/L (ref 135–145)
Total Bilirubin: 0.7 mg/dL (ref 0.2–1.2)
Total Protein: 6.5 g/dL (ref 6.0–8.3)

## 2022-08-22 LAB — LIPID PANEL
Cholesterol: 173 mg/dL (ref 0–200)
HDL: 65.5 mg/dL (ref >39.00)
LDL Cholesterol: 96 mg/dL (ref 0–99)
NonHDL: 107.53
Total CHOL/HDL Ratio: 3
Triglycerides: 57 mg/dL (ref 0.0–149.0)
VLDL: 11.4 mg/dL (ref 0.0–40.0)

## 2022-08-22 LAB — TSH: TSH: 2.19 u[IU]/mL (ref 0.35–5.50)

## 2022-08-22 NOTE — Progress Notes (Signed)
Subjective:    Patient ID: Gabrielle Gilbert, female    DOB: 1975-11-13, 47 y.o.   MRN: [id number]  Chief Complaint  Patient presents with   Annual Exam    Pt in the office for annual CPE and fasting labs; rash still not changed but not got worse; no other concerns to discuss    HPI Patient is in today for annual exam.  Health maintenance: Lifestyle/ exercise: Working out in mornings 4-6 times per week  Nutrition: Well-balanced  Mental health: Doing well  Sleep: No issues, overall well  Substance use: None  Sexual activity: Monogamous, no concerns  Immunizations: UTD Colonoscopy: UTD Pap: UTD Mammogram: UTD  Skin: No concerns, sees dermatology annually  Hx hysterectomy - no periods    Past Medical History:  Diagnosis Date   Abnormal Pap smear    Diverticulitis    Dysmenorrhea    Endometriosis    Fibroid    Gallstones    Heart murmur    History of fainting spells of unknown cause    Hormone disorder    Infertility, female    Migraine with aura    Rosacea     Past Surgical History:  Procedure Laterality Date   ABDOMINAL HYSTERECTOMY     CERVICAL BIOPSY  W/ LOOP ELECTRODE EXCISION     CESAREAN SECTION  12/02/2011   Procedure: CESAREAN SECTION;  Surgeon: Marvene Staff, MD;  Location: [facility];  Service: Gynecology;  Laterality: N/A;   CHOLECYSTECTOMY     CYSTOSCOPY N/A 05/07/2018   Procedure: CYSTOSCOPY;  Surgeon: Salvadore Dom, MD;  Location: [facility];  Service: Gynecology;  Laterality: N/A;   DILATION AND CURETTAGE OF UTERUS     history of leep     1998   HYSTEROSCOPY     LAPAROSCOPIC OVARIAN CYSTECTOMY     LAPAROSCOPY     LEEP     MYOMECTOMY     robotic myomectomy, Dr Kerin Perna    TOTAL LAPAROSCOPIC HYSTERECTOMY WITH SALPINGECTOMY Bilateral 05/07/2018   Procedure: TOTAL LAPAROSCOPIC HYSTERECTOMY WITH SALPINGECTOMY;  Surgeon: Salvadore Dom, MD;  Location: [facility];  Service: Gynecology;  Laterality:  Bilateral;  extended stay recovery   WISDOM TOOTH EXTRACTION      Family History  Problem Relation Age of Onset   Hyperlipidemia Mother    Heart disease Mother    Hypertension Mother    Fibroids Mother    Hyperlipidemia Father    Heart disease Father    Hypertension Father    Heart disease Sister    Colon cancer Paternal Grandmother    Colon cancer Maternal Uncle    Anesthesia problems Neg Hx    Esophageal cancer Neg Hx    Stomach cancer Neg Hx    Rectal cancer Neg Hx     Social History   Tobacco Use   Smoking status: Never   Smokeless tobacco: Never  Vaping Use   Vaping Use: Never used  Substance Use Topics   Alcohol use: Yes    Alcohol/week: 3.0 standard drinks of alcohol    Types: 3 Standard drinks or equivalent per week   Drug use: No     No Known Allergies  Review of Systems NEGATIVE UNLESS OTHERWISE INDICATED IN HPI      Objective:     BP 124/74 (BP Location: Left Arm)   Pulse 64   Temp (!) 97.5 F (36.4 C) (Temporal)   Ht 5\' 1"  (1.549 m)   Wt 190  lb (86.2 kg)   LMP 04/27/2018 (Exact Date) Comment: 04/30/18 urine preg.negative  SpO2 99%   BMI 35.90 kg/m   Wt Readings from Last 3 Encounters:  08/22/22 190 lb (86.2 kg)  08/19/22 189 lb 6.4 oz (85.9 kg)  06/21/22 197 lb (89.4 kg)    BP Readings from Last 3 Encounters:  08/22/22 124/74  08/19/22 112/78  06/21/22 112/70     Physical Exam Vitals and nursing note reviewed.  Constitutional:      Appearance: Normal appearance. She is normal weight. She is not toxic-appearing.  HENT:     Head: Normocephalic and atraumatic.     Right Ear: Tympanic membrane, ear canal and external ear normal.     Left Ear: Tympanic membrane, ear canal and external ear normal.     Nose: Nose normal.     Mouth/Throat:     Mouth: Mucous membranes are moist.  Eyes:     Extraocular Movements: Extraocular movements intact.     Conjunctiva/sclera: Conjunctivae normal.     Pupils: Pupils are equal, round, and  reactive to light.  Cardiovascular:     Rate and Rhythm: Regular rhythm. Bradycardia present.     Pulses: Normal pulses.     Heart sounds: Normal heart sounds.  Pulmonary:     Effort: Pulmonary effort is normal.     Breath sounds: Normal breath sounds.  Abdominal:     General: Abdomen is flat. Bowel sounds are normal.     Palpations: Abdomen is soft.  Musculoskeletal:        General: Normal range of motion.     Cervical back: Normal range of motion and neck supple.  Skin:    General: Skin is warm and dry.  Neurological:     General: No focal deficit present.     Mental Status: She is alert and oriented to person, place, and time.  Psychiatric:        Mood and Affect: Mood normal.        Behavior: Behavior normal.        Thought Content: Thought content normal.        Judgment: Judgment normal.        Assessment & Plan:  Encounter for annual physical exam Assessment & Plan: Age-appropriate screening and counseling performed today. Will check labs and call with results. Preventive measures discussed and printed in AVS for patient.   Patient Counseling: [x]   Nutrition: Stressed importance of moderation in sodium/caffeine intake, saturated fat and cholesterol, caloric balance, sufficient intake of fresh fruits, vegetables, and fiber.  [x]   Stressed the importance of regular exercise.   []   Substance Abuse: Discussed cessation/primary prevention of tobacco, alcohol, or other drug use; driving or other dangerous activities under the influence; availability of treatment for abuse.   []   Injury prevention: Discussed safety belts, safety helmets, smoke detector, smoking near bedding or upholstery.   []   Sexuality: Discussed sexually transmitted diseases, partner selection, use of condoms, avoidance of unintended pregnancy  and contraceptive alternatives.   [x]   Dental health: Discussed importance of regular tooth brushing, flossing, and dental visits.  [x]   Health maintenance and  immunizations reviewed. Please refer to Health maintenance section.      Orders: -     CBC with Differential/Platelet -     Comprehensive metabolic panel -     Lipid panel -     TSH  Mixed hyperlipidemia Assessment & Plan: Lifestyle controlled -very active, balanced diet Recheck labs today   Orders: -  Lipid panel  Sinus bradycardia Assessment & Plan: Exercises very regularly Follows with cardiology          Return in about 1 year (around 08/22/2023) for CPE, fasting labs .     Murle Hellstrom M Weber Monnier, PA-C

## 2022-08-22 NOTE — Assessment & Plan Note (Signed)
Patient has a history of PFO with L-->R shunting on TEE and TTE.  No indication for closure.  She is asymptomatic.  Right atrium and right ventricle have been normal in size.  She has no history of DVT, stroke, or TIA.  I am not sure that she has a true indication for aspirin.  I do not see any evidence for this and AHA/HCC or ESC guidelines.  Will consider stopping.  She is due for repeat echo to make sure there is no hemodynamic changes.

## 2022-08-22 NOTE — Assessment & Plan Note (Signed)
Age-appropriate screening and counseling performed today. Will check labs and call with results. Preventive measures discussed and printed in AVS for patient.   Patient Counseling: [x]  Nutrition: Stressed importance of moderation in sodium/caffeine intake, saturated fat and cholesterol, caloric balance, sufficient intake of fresh fruits, vegetables, and fiber.  [x]  Stressed the importance of regular exercise.   []  Substance Abuse: Discussed cessation/primary prevention of tobacco, alcohol, or other drug use; driving or other dangerous activities under the influence; availability of treatment for abuse.   []  Injury prevention: Discussed safety belts, safety helmets, smoke detector, smoking near bedding or upholstery.   []  Sexuality: Discussed sexually transmitted diseases, partner selection, use of condoms, avoidance of unintended pregnancy  and contraceptive alternatives.   [x]  Dental health: Discussed importance of regular tooth brushing, flossing, and dental visits.  [x]  Health maintenance and immunizations reviewed. Please refer to Health maintenance section.     

## 2022-08-22 NOTE — Assessment & Plan Note (Signed)
Exercises very regularly Follows with cardiology

## 2022-08-22 NOTE — Assessment & Plan Note (Signed)
Lifestyle controlled -very active, balanced diet Recheck labs today

## 2022-08-22 NOTE — Patient Instructions (Signed)
Medication Instructions:  Your physician recommends that you continue on your current medications as directed. Please refer to the Current Medication list given to you today.  *If you need a refill on your cardiac medications before your next appointment, please call your pharmacy*  Lab Work: NONE  Testing/Procedures: Your physician has requested that you have an echocardiogram. Echocardiography is a painless test that uses sound waves to create images of your heart. It provides your doctor with information about the size and shape of your heart and how well your heart's chambers and valves are working. This procedure takes approximately one hour. There are no restrictions for this procedure. Please do NOT wear cologne, perfume, aftershave, or lotions (deodorant is allowed). Please arrive 15 minutes prior to your appointment time.  Follow-Up: At [facility], you and your health needs are our priority.  As part of our continuing mission to provide you with exceptional heart care, we have created designated Provider Care Teams.  These Care Teams include your primary Cardiologist (physician) and Advanced Practice Providers (APPs -  Physician Assistants and Nurse Practitioners) who all work together to provide you with the care you need, when you need it.  We recommend signing up for the patient portal called "MyChart".  Sign up information is provided on this After Visit Summary.  MyChart is used to connect with patients for Virtual Visits (Telemedicine).  Patients are able to view lab/test results, encounter notes, upcoming appointments, etc.  Non-urgent messages can be sent to your provider as well.   To learn more about what you can do with MyChart, go to [email or web address].    Your next appointment:   12 month(s)  The format for your next appointment:   In Person  Provider:   Dr Oval Linsey

## 2022-08-22 NOTE — Progress Notes (Signed)
Cardiology Office Note:    Date:  08/22/2022   ID:  QURAN HOBDAY, DOB [birth year], MRN [id number]  PCP:  Allwardt, Randa Evens, PA-C   [facility] Providers Cardiologist:  None     Referring MD: Fredirick Lathe, PA-C   No chief complaint on file.   History of Present Illness:    Gabrielle Gilbert is a 47 y.o. female with a hx of atrial septal aneurysm with patent foramen ovale with right-to-left shunting, heart murmur, here for follow-up. She was previously followed by Dr. Geraldo Pitter, last seen by him 06/24/2019. At that visit she was generally doing well and asymptomatic. She had an echocardiogram 06/2019 revealed LVEF 60-65%, normal ventricular function, and no LVH. Interatrial septum aneurysm with shunting was evident on color doppler (documented on prior study). She declined further work-up at that time. She followed up with Truitt Merle, NP on 07/08/2020 where she was well and focusing on weight training. It was recommended to start ASA and increase her aerobic activity as well.  She presented to the ED 06/13/2022 with LLQ pain. CT scan results showed nonperforated acute diverticulitis involving the descending colon. One dose of Augmentin was given in the ED and she was discharged with a 7-day prescription.  Today, she reports feeling well overall aside from some back pain. She denies any new cardiovascular concerns. She confirms a history of fainting spells, with subsequent discovery of her PFO. Typically she also confirms a bradycardic heart rate (42-52 bpm on average). Routinely she exercises with Cross[employer] workouts. No anginal symptoms. Earlier today she saw her PCP for her annual physical with lab work. She denies any palpitations, chest pain, shortness of breath, or peripheral edema. No lightheadedness, headaches, syncope, orthopnea, or PND.   Past Medical History:  Diagnosis Date   Abnormal Pap smear    Diverticulitis    Dysmenorrhea    Endometriosis    Fibroid    Gallstones     Heart murmur    History of fainting spells of unknown cause    Hormone disorder    Infertility, female    Migraine with aura    Rosacea     Past Surgical History:  Procedure Laterality Date   ABDOMINAL HYSTERECTOMY     CERVICAL BIOPSY  W/ LOOP ELECTRODE EXCISION     CESAREAN SECTION  12/02/2011   Procedure: CESAREAN SECTION;  Surgeon: Marvene Staff, MD;  Location: [facility];  Service: Gynecology;  Laterality: N/A;   CHOLECYSTECTOMY     CYSTOSCOPY N/A 05/07/2018   Procedure: CYSTOSCOPY;  Surgeon: Salvadore Dom, MD;  Location: [facility];  Service: Gynecology;  Laterality: N/A;   DILATION AND CURETTAGE OF UTERUS     history of leep     1998   HYSTEROSCOPY     LAPAROSCOPIC OVARIAN CYSTECTOMY     LAPAROSCOPY     LEEP     MYOMECTOMY     robotic myomectomy, Dr Kerin Perna    TOTAL LAPAROSCOPIC HYSTERECTOMY WITH SALPINGECTOMY Bilateral 05/07/2018   Procedure: TOTAL LAPAROSCOPIC HYSTERECTOMY WITH SALPINGECTOMY;  Surgeon: Salvadore Dom, MD;  Location: [facility];  Service: Gynecology;  Laterality: Bilateral;  extended stay recovery   WISDOM TOOTH EXTRACTION      Current Medications: Current Meds  Medication Sig   Ascorbic Acid (VITAMIN C) 100 MG tablet Take 100 mg by mouth daily.   aspirin 325 MG tablet Take 325 mg by mouth daily.   cetirizine (ZYRTEC) 10 MG tablet Take 10 mg  by mouth daily.   hyoscyamine (LEVSIN SL) 0.125 MG SL tablet Place 1 tablet (0.125 mg total) under the tongue every 6 (six) hours as needed.   Multiple Vitamin (MULTIVITAMIN) capsule Take 1 capsule by mouth daily.   Omega-3 Fatty Acids (FISH OIL PO) Take by mouth.   triamcinolone cream (KENALOG) 0.1 % Apply 1 Application topically 2 (two) times daily. Do not use longer than 2 weeks consistently.     Allergies:   Patient has no known allergies.   Social History   Socioeconomic History   Marital status: Married    Spouse name: Not on file   Number of  children: Not on file   Years of education: Not on file   Highest education level: Not on file  Occupational History   Not on file  Tobacco Use   Smoking status: Never   Smokeless tobacco: Never  Vaping Use   Vaping Use: Never used  Substance and Sexual Activity   Alcohol use: Yes    Alcohol/week: 3.0 standard drinks of alcohol    Types: 3 Standard drinks or equivalent per week   Drug use: No   Sexual activity: Yes    Birth control/protection: Surgical    Comment: hysterecomy  Other Topics Concern   Not on file  Social History Narrative   Not on file   Social Determinants of Health   Financial Resource Strain: Not on file  Food Insecurity: Not on file  Transportation Needs: Not on file  Physical Activity: Not on file  Stress: Not on file  Social Connections: Not on file     Family History: The patient's family history includes Atrial fibrillation in her mother; CAD in her father; Colon cancer in her maternal uncle and paternal grandmother; Congenital heart disease in her daughter; Fibroids in her mother; Heart attack in her paternal grandfather; Heart disease in her father, mother, and sister; Hyperlipidemia in her father and mother; Hypertension in her father and mother; Valvular heart disease in her father and sister. There is no history of Anesthesia problems, Esophageal cancer, Stomach cancer, or Rectal cancer.  ROS:   Please see the history of present illness.    All other systems reviewed and are negative.  EKGs/Labs/Other Studies Reviewed:    The following studies were reviewed today:  Echocardiogram  06/26/2019:  1. Left ventricular ejection fraction, by visual estimation, is 60 to  65%. The left ventricle has normal function. There is no left ventricular  hypertrophy.   2. The left ventricle has no regional wall motion abnormalities.   3. Evidence of atrial level shunting detected by color flow  Doppler.(Interatrial septum aneurysm with shunting evident on  color  doppler). Documented on previous study.   Echo Stress Test  03/24/2017: Study Conclusions  - Stress ECG conclusions: There were no stress arrhythmias or    conduction abnormalities. The stress ECG was negative for    ischemia.  - Staged echo: There was no echocardiographic evidence for    stress-induced ischemia.   Impressions:  - Normal stress EKG and stress echo.   Holter Monitor 03/2017: Date of test:                 03/10/2017 Duration of test:           48 hours Indication:                    Syncope and collapse Ordering physician:  Jyl Heinz  Referring physician:            Jyl Heinz   Baseline rhythm: sinus   Minimum heart rate: 41BPM.  Average heart rate: 65BPM.  Maximal heart rate 166 BPM at 5:30 PM.   Atrial arrhythmia: occasional PACs   Ventricular arrhythmia: occasional PVCs   Conduction abnormality: not significant   Symptoms: none   Conclusion:  Unremarkable Holter monitor. Patient did not report any symptoms.  EKG:   EKG is personally reviewed. 08/22/2022: Sinus bradycardia. Rate 49 bpm. 06/24/2019 (Dr. Geraldo Pitter):  NSR and nonspecific ST-T changes.  Recent Labs: 06/13/2022: ALT 13; BUN 10; Creatinine, Ser 0.87; Hemoglobin 14.0; Platelets 223; Potassium 4.0; Sodium 137   Recent Lipid Panel    Component Value Date/Time   CHOL 162 08/03/2021 0918   CHOL 180 01/02/2019 1323   TRIG 93.0 08/03/2021 0918   HDL 57.00 08/03/2021 0918   HDL 56 01/02/2019 1323   CHOLHDL 3 08/03/2021 0918   VLDL 18.6 08/03/2021 0918   LDLCALC 87 08/03/2021 0918   LDLCALC 107 (H) 01/02/2019 1323     Risk Assessment/Calculations:           Physical Exam:    Wt Readings from Last 3 Encounters:  08/22/22 193 lb 11.2 oz (87.9 kg)  08/22/22 190 lb (86.2 kg)  08/19/22 189 lb 6.4 oz (85.9 kg)     VS:  BP 118/70 (BP Location: Left Arm, Patient Position: Sitting, Cuff Size: Large)   Pulse (!) 49   Ht 5\' 1"  (1.549 m)   Wt 193 lb 11.2 oz (87.9 kg)    LMP 04/27/2018 (Exact Date) Comment: 04/30/18 urine preg.negative  BMI 36.60 kg/m  , BMI Body mass index is 36.6 kg/m. GENERAL:  Well appearing HEENT: Pupils equal round and reactive, fundi not visualized, oral mucosa unremarkable NECK:  No jugular venous distention, waveform within normal limits, carotid upstroke brisk and symmetric, no bruits, no thyromegaly LUNGS:  Clear to auscultation bilaterally HEART:  RRR.  PMI not displaced or sustained,S1 and S2 within normal limits, no S3, no S4, no clicks, no rubs, no murmurs ABD:  Flat, positive bowel sounds normal in frequency in pitch, no bruits, no rebound, no guarding, no midline pulsatile mass, no hepatomegaly, no splenomegaly EXT:  2 plus pulses throughout, no edema, no cyanosis no clubbing SKIN:  No rashes no nodules NEURO:  Cranial nerves II through XII grossly intact, motor grossly intact throughout PSYCH:  Cognitively intact, oriented to person place and time   ASSESSMENT:    1. Sinus bradycardia   2. PFO (patent foramen ovale)   3. Patent foramen ovale with atrial septal aneurysm    PLAN:    Patent foramen ovale with atrial septal aneurysm Patient has a history of PFO with L-->R shunting on TEE and TTE.  No indication for closure.  She is asymptomatic.  Right atrium and right ventricle have been normal in size.  She has no history of DVT, stroke, or TIA.  I am not sure that she has a true indication for aspirin.  I do not see any evidence for this and AHA/HCC or ESC guidelines.  Will consider stopping.  She is due for repeat echo to make sure there is no hemodynamic changes.  Sinus bradycardia She has chronic bradycardia already and is asymptomatic.  No indication for intervention.        Disposition: FU with Cori Justus C. Oval Linsey, MD, [facility] in 1 year.  Medication Adjustments/Labs and Tests Ordered: Current medicines are reviewed at  length with the patient today.  Concerns regarding medicines are outlined above.   Orders  Placed This Encounter  Procedures   EKG 12-Lead   ECHOCARDIOGRAM COMPLETE   No orders of the defined types were placed in this encounter.  Patient Instructions  Medication Instructions:  Your physician recommends that you continue on your current medications as directed. Please refer to the Current Medication list given to you today.  *If you need a refill on your cardiac medications before your next appointment, please call your pharmacy*  Lab Work: NONE  Testing/Procedures: Your physician has requested that you have an echocardiogram. Echocardiography is a painless test that uses sound waves to create images of your heart. It provides your doctor with information about the size and shape of your heart and how well your heart's chambers and valves are working. This procedure takes approximately one hour. There are no restrictions for this procedure. Please do NOT wear cologne, perfume, aftershave, or lotions (deodorant is allowed). Please arrive 15 minutes prior to your appointment time.  Follow-Up: At [facility], you and your health needs are our priority.  As part of our continuing mission to provide you with exceptional heart care, we have created designated Provider Care Teams.  These Care Teams include your primary Cardiologist (physician) and Advanced Practice Providers (APPs -  Physician Assistants and Nurse Practitioners) who all work together to provide you with the care you need, when you need it.  We recommend signing up for the patient portal called "MyChart".  Sign up information is provided on this After Visit Summary.  MyChart is used to connect with patients for Virtual Visits (Telemedicine).  Patients are able to view lab/test results, encounter notes, upcoming appointments, etc.  Non-urgent messages can be sent to your provider as well.   To learn more about what you can do with MyChart, go to [email or web address].    Your next appointment:   12  month(s)  The format for your next appointment:   In Person  Provider:   Dr Wilhemina Bonito [place] as a scribe for Skeet Latch, MD.,have documented all relevant documentation on the behalf of Skeet Latch, MD,as directed by  Skeet Latch, MD while in the presence of Skeet Latch, MD.  I, [place] Oval Linsey, MD have reviewed all documentation for this visit.  The documentation of the exam, diagnosis, procedures, and orders on 08/22/2022 are all accurate and complete.   Signed, Skeet Latch, MD  08/22/2022 1:54 PM    [place]

## 2022-08-22 NOTE — Assessment & Plan Note (Signed)
She has chronic bradycardia already and is asymptomatic.  No indication for intervention.

## 2022-09-20 ENCOUNTER — Ambulatory Visit (HOSPITAL_BASED_OUTPATIENT_CLINIC_OR_DEPARTMENT_OTHER): Payer: No Typology Code available for payment source

## 2022-09-20 DIAGNOSIS — R001 Bradycardia, unspecified: Secondary | ICD-10-CM | POA: Diagnosis not present

## 2022-09-20 DIAGNOSIS — Q2112 Patent foramen ovale: Secondary | ICD-10-CM | POA: Diagnosis not present

## 2022-09-20 LAB — ECHOCARDIOGRAM COMPLETE
Area-P 1/2: 4.31 cm2
S' Lateral: 2.67 cm

## 2023-05-10 ENCOUNTER — Other Ambulatory Visit: Payer: Self-pay | Admitting: Physician Assistant

## 2023-05-10 DIAGNOSIS — Z1231 Encounter for screening mammogram for malignant neoplasm of breast: Secondary | ICD-10-CM

## 2023-05-19 ENCOUNTER — Ambulatory Visit
Admission: RE | Admit: 2023-05-19 | Discharge: 2023-05-19 | Disposition: A | Discharge status: Home / Self Care | Payer: No Typology Code available for payment source | Source: Ambulatory Visit | Attending: Physician Assistant

## 2023-05-19 DIAGNOSIS — Z1231 Encounter for screening mammogram for malignant neoplasm of breast: Secondary | ICD-10-CM

## 2023-08-24 ENCOUNTER — Encounter: Payer: Self-pay | Admitting: Physician Assistant

## 2023-08-24 ENCOUNTER — Ambulatory Visit (INDEPENDENT_AMBULATORY_CARE_PROVIDER_SITE_OTHER): Payer: No Typology Code available for payment source | Admitting: Physician Assistant

## 2023-08-24 VITALS — BP 114/74 | HR 59 | Temp 98.2°F | Ht 61.42 in | Wt 185.4 lb

## 2023-08-24 DIAGNOSIS — K9041 Non-celiac gluten sensitivity: Secondary | ICD-10-CM

## 2023-08-24 DIAGNOSIS — Z0000 Encounter for general adult medical examination without abnormal findings: Secondary | ICD-10-CM | POA: Diagnosis not present

## 2023-08-24 DIAGNOSIS — L249 Irritant contact dermatitis, unspecified cause: Secondary | ICD-10-CM | POA: Diagnosis not present

## 2023-08-24 DIAGNOSIS — Z1159 Encounter for screening for other viral diseases: Secondary | ICD-10-CM

## 2023-08-24 LAB — CBC WITH DIFFERENTIAL/PLATELET
Basophils Absolute: 0 10*3/uL (ref 0.0–0.1)
Basophils Relative: 0.4 % (ref 0.0–3.0)
Eosinophils Absolute: 0.1 10*3/uL (ref 0.0–0.7)
Eosinophils Relative: 1.3 % (ref 0.0–5.0)
HCT: 43.6 % (ref 36.0–46.0)
Hemoglobin: 14.6 g/dL (ref 12.0–15.0)
Lymphocytes Relative: 22.3 % (ref 12.0–46.0)
Lymphs Abs: 1.6 10*3/uL (ref 0.7–4.0)
MCHC: 33.4 g/dL (ref 30.0–36.0)
MCV: 88.6 fl (ref 78.0–100.0)
Monocytes Absolute: 0.4 10*3/uL (ref 0.1–1.0)
Monocytes Relative: 5.9 % (ref 3.0–12.0)
Neutro Abs: 5.2 10*3/uL (ref 1.4–7.7)
Neutrophils Relative %: 70.1 % (ref 43.0–77.0)
Platelets: 224 10*3/uL (ref 150.0–400.0)
RBC: 4.92 Mil/uL (ref 3.87–5.11)
RDW: 13.3 % (ref 11.5–15.5)
WBC: 7.4 10*3/uL (ref 4.0–10.5)

## 2023-08-24 LAB — HEMOGLOBIN A1C: Hgb A1c MFr Bld: 4.9 % (ref 4.6–6.5)

## 2023-08-24 LAB — COMPREHENSIVE METABOLIC PANEL
ALT: 14 U/L (ref 0–35)
AST: 17 U/L (ref 0–37)
Albumin: 4.3 g/dL (ref 3.5–5.2)
Alkaline Phosphatase: 42 U/L (ref 39–117)
BUN: 18 mg/dL (ref 6–23)
CO2: 26 meq/L (ref 19–32)
Calcium: 8.9 mg/dL (ref 8.4–10.5)
Chloride: 107 meq/L (ref 96–112)
Creatinine, Ser: 1.02 mg/dL (ref 0.40–1.20)
GFR: 66.48 mL/min (ref >60.00)
Glucose, Bld: 85 mg/dL (ref 70–99)
Potassium: 3.7 meq/L (ref 3.5–5.1)
Sodium: 140 meq/L (ref 135–145)
Total Bilirubin: 0.8 mg/dL (ref 0.2–1.2)
Total Protein: 6.9 g/dL (ref 6.0–8.3)

## 2023-08-24 LAB — LIPID PANEL
Cholesterol: 194 mg/dL (ref 0–200)
HDL: 57.9 mg/dL (ref >39.00)
LDL Cholesterol: 124 mg/dL — ABNORMAL HIGH (ref 0–99)
NonHDL: 136.21
Total CHOL/HDL Ratio: 3
Triglycerides: 61 mg/dL (ref 0.0–149.0)
VLDL: 12.2 mg/dL (ref 0.0–40.0)

## 2023-08-24 LAB — VITAMIN D 25 HYDROXY (VIT D DEFICIENCY, FRACTURES): VITD: 55.99 ng/mL (ref 30.00–100.00)

## 2023-08-24 LAB — VITAMIN B12: Vitamin B-12: 579 pg/mL (ref 211–911)

## 2023-08-24 LAB — TSH: TSH: 1.75 u[IU]/mL (ref 0.35–5.50)

## 2023-08-24 NOTE — Progress Notes (Signed)
 Patient ID: Gabrielle Gilbert, female    DOB: 1975-07-18, 48 y.o.   MRN: [id number]   Assessment & Plan:  Annual physical exam -     CBC with Differential/Platelet -     Comprehensive metabolic panel -     Hemoglobin A1c -     Lipid panel -     TSH -     Vitamin B12 -     VITAMIN D 25 Hydroxy (Vit-D Deficiency, Fractures)  Need for hepatitis C screening test -     Hepatitis C antibody, reflex  Irritant contact dermatitis, unspecified trigger  Gluten-sensitive enteropathy      Assessment & Plan Weight Fluctuation Weight fluctuations despite semaglutide and lifestyle efforts. Influenced by stress, sleep, and hormonal changes. Aware of upcoming semaglutide ban. - Continue semaglutide until April 22. - Monitor weight and dietary intake. - Consider alternative weight management strategies post-April 22. - She is working with [facility] on this prescription  Gluten Sensitivity Gluten sensitivity managed with a gluten-free diet, improving symptoms. - Continue gluten-free diet.  Contact Dermatitis Contact dermatitis likely due to laundry detergent. Treated with steroid injection and cream. - Apply steroid cream to affected areas three times a day. - Continue Benadryl as needed for itching. - Monitor for improvement and avoid suspected irritants.   Age-appropriate screening and counseling performed today. Will check labs and call with results. Preventive measures discussed and printed in AVS for patient.   Patient Counseling: [x]   Nutrition: Stressed importance of moderation in sodium/caffeine intake, saturated fat and cholesterol, caloric balance, sufficient intake of fresh fruits, vegetables, and fiber.  [x]   Stressed the importance of regular exercise.   []   Substance Abuse: Discussed cessation/primary prevention of tobacco, alcohol, or other drug use; driving or other dangerous activities under the influence; availability of treatment for abuse.   [x]   Injury prevention:  Discussed safety belts, safety helmets, smoke detector, smoking near bedding or upholstery.   []   Sexuality: Discussed sexually transmitted diseases, partner selection, use of condoms, avoidance of unintended pregnancy  and contraceptive alternatives.   [x]   Dental health: Discussed importance of regular tooth brushing, flossing, and dental visits.  [x]   Health maintenance and immunizations reviewed. Please refer to Health maintenance section.        Return in about 1 year (around 08/23/2024) for physical.    Subjective:    Chief Complaint  Patient presents with   Annual Exam    Pt in office for annual CPE and fasting labs; pt up to date on Pap and Mammorgram; Semaglutide 7.5 injection added compund    HPI Discussed the use of AI scribe software for clinical note transcription with the patient, who gave verbal consent to proceed.  History of Present Illness Gabrielle Gilbert is a 48 year old female who presents for a follow-up on weight management and rash.  She has been using a semaglutide compound since November and has lost about 15 pounds. However, she experiences fluctuations in her weight, losing and gaining weight intermittently. She attributes some of this to not eating enough, consuming just over 1000 calories a day, and has been working with a nutritionist since August. She exercises 4 to 6 days a week, sometimes twice a day, but struggles to maintain a stable weight around 180-185 pounds. She has a history of diverticulitis, with a significant flare-up last year that led to weight loss due to inability to eat and a course of antibiotics. She has since cut gluten from  her diet, which has alleviated her stomach issues.  She reports a rash that started on her neck and spread to various parts of her body, including her ankles, arms, and belly button. She suspects it might be related to laundry detergent. She received a steroid injection and cream for treatment and has been taking  Benadryl as needed. No other skin changes reported.  She experiences sleep disturbances, particularly around the time she would normally have her menstrual period, despite having had a hysterectomy. She wakes up around 3:00 to 3:30 AM for several days and has been feeling extremely exhausted, taking Benadryl at night to help with sleep.      Past Medical History:  Diagnosis Date   Abnormal Pap smear    Diverticulitis    Dysmenorrhea    Endometriosis    Fibroid    Gallstones    Heart murmur    History of fainting spells of unknown cause    Hormone disorder    Infertility, female    Migraine with aura    Rosacea     Past Surgical History:  Procedure Laterality Date   ABDOMINAL HYSTERECTOMY     CERVICAL BIOPSY  W/ LOOP ELECTRODE EXCISION     CESAREAN SECTION  12/02/2011   Procedure: CESAREAN SECTION;  Surgeon: Serita Kyle, MD;  Location: [facility];  Service: Gynecology;  Laterality: N/A;   CHOLECYSTECTOMY     CYSTOSCOPY N/A 05/07/2018   Procedure: CYSTOSCOPY;  Surgeon: Romualdo Bolk, MD;  Location: [facility];  Service: Gynecology;  Laterality: N/A;   DILATION AND CURETTAGE OF UTERUS     history of leep     1998   HYSTEROSCOPY     LAPAROSCOPIC OVARIAN CYSTECTOMY     LAPAROSCOPY     LEEP     MYOMECTOMY     robotic myomectomy, Dr April Manson    TOTAL LAPAROSCOPIC HYSTERECTOMY WITH SALPINGECTOMY Bilateral 05/07/2018   Procedure: TOTAL LAPAROSCOPIC HYSTERECTOMY WITH SALPINGECTOMY;  Surgeon: Romualdo Bolk, MD;  Location: [facility];  Service: Gynecology;  Laterality: Bilateral;  extended stay recovery   WISDOM TOOTH EXTRACTION      Family History  Problem Relation Age of Onset   Hyperlipidemia Mother    Heart disease Mother    Hypertension Mother    Fibroids Mother    Atrial fibrillation Mother    Hyperlipidemia Father    Heart disease Father    Hypertension Father    CAD Father    Valvular heart disease Father     Heart disease Sister    Valvular heart disease Sister    Colon cancer Maternal Uncle    Colon cancer Paternal Grandmother    Heart attack Paternal Grandfather    Congenital heart disease Daughter    Anesthesia problems Neg Hx    Esophageal cancer Neg Hx    Stomach cancer Neg Hx    Rectal cancer Neg Hx     Social History   Tobacco Use   Smoking status: Never   Smokeless tobacco: Never  Vaping Use   Vaping status: Never Used  Substance Use Topics   Alcohol use: Yes    Alcohol/week: 3.0 standard drinks of alcohol    Types: 3 Standard drinks or equivalent per week   Drug use: No     Allergies  Allergen Reactions   Bio-35 Gluten-Free [Actical]     Review of Systems NEGATIVE UNLESS OTHERWISE INDICATED IN HPI      Objective:  BP 114/74 (BP Location: Left Arm, Patient Position: Sitting, Cuff Size: Large)   Pulse (!) 59   Temp 98.2 F (36.8 C) (Temporal)   Ht 5' 1.42" (1.56 m)   Wt 185 lb 6.4 oz (84.1 kg)   LMP 04/27/2018 (Exact Date) Comment: 04/30/18 urine preg.negative  SpO2 98%   BMI 34.56 kg/m   Wt Readings from Last 3 Encounters:  08/24/23 185 lb 6.4 oz (84.1 kg)  08/22/22 193 lb 11.2 oz (87.9 kg)  08/22/22 190 lb (86.2 kg)    BP Readings from Last 3 Encounters:  08/24/23 114/74  08/22/22 118/70  08/22/22 124/74     Physical Exam Vitals and nursing note reviewed.  Constitutional:      Appearance: Normal appearance. She is not toxic-appearing.  HENT:     Head: Normocephalic and atraumatic.     Right Ear: Tympanic membrane, ear canal and external ear normal.     Left Ear: Tympanic membrane, ear canal and external ear normal.     Nose: Nose normal.     Mouth/Throat:     Mouth: Mucous membranes are moist.  Eyes:     Extraocular Movements: Extraocular movements intact.     Conjunctiva/sclera: Conjunctivae normal.     Pupils: Pupils are equal, round, and reactive to light.  Cardiovascular:     Rate and Rhythm: Normal rate and regular rhythm.      Pulses: Normal pulses.     Heart sounds: Normal heart sounds.  Pulmonary:     Effort: Pulmonary effort is normal.     Breath sounds: Normal breath sounds.  Abdominal:     General: Abdomen is flat. Bowel sounds are normal.     Palpations: Abdomen is soft.  Musculoskeletal:        General: Normal range of motion.     Cervical back: Normal range of motion and neck supple.  Skin:    General: Skin is warm and dry.     Findings: Rash (contact derm noted around ankles and inside umbilicus) present.  Neurological:     General: No focal deficit present.     Mental Status: She is alert and oriented to person, place, and time.  Psychiatric:        Mood and Affect: Mood normal.        Behavior: Behavior normal.        Thought Content: Thought content normal.        Judgment: Judgment normal.        Degan Hanser M Benzion Mesta, PA-C

## 2023-08-29 ENCOUNTER — Encounter: Payer: Self-pay | Admitting: Physician Assistant

## 2023-10-18 ENCOUNTER — Other Ambulatory Visit (HOSPITAL_BASED_OUTPATIENT_CLINIC_OR_DEPARTMENT_OTHER): Payer: Self-pay

## 2023-10-18 MED ORDER — PROGESTERONE MICRONIZED 100 MG PO CAPS
100.0000 mg | ORAL_CAPSULE | Freq: Every day | ORAL | 1 refills | Status: DC
  Filled 2023-10-18: qty 60, 30d supply, fill #0
  Filled 2023-11-15: qty 60, 30d supply, fill #1

## 2023-11-15 ENCOUNTER — Other Ambulatory Visit: Payer: Self-pay

## 2023-11-16 ENCOUNTER — Other Ambulatory Visit (HOSPITAL_BASED_OUTPATIENT_CLINIC_OR_DEPARTMENT_OTHER): Payer: Self-pay

## 2023-12-15 ENCOUNTER — Other Ambulatory Visit (HOSPITAL_BASED_OUTPATIENT_CLINIC_OR_DEPARTMENT_OTHER): Payer: Self-pay

## 2023-12-25 ENCOUNTER — Other Ambulatory Visit (HOSPITAL_BASED_OUTPATIENT_CLINIC_OR_DEPARTMENT_OTHER): Payer: Self-pay

## 2023-12-25 MED ORDER — PROGESTERONE MICRONIZED 100 MG PO CAPS
100.0000 mg | ORAL_CAPSULE | Freq: Every day | ORAL | 0 refills | Status: DC
  Filled 2023-12-25: qty 90, 45d supply, fill #0

## 2024-02-13 ENCOUNTER — Other Ambulatory Visit (HOSPITAL_BASED_OUTPATIENT_CLINIC_OR_DEPARTMENT_OTHER): Payer: Self-pay

## 2024-02-14 ENCOUNTER — Other Ambulatory Visit (HOSPITAL_BASED_OUTPATIENT_CLINIC_OR_DEPARTMENT_OTHER): Payer: Self-pay

## 2024-02-14 MED ORDER — PROGESTERONE MICRONIZED 100 MG PO CAPS
100.0000 mg | ORAL_CAPSULE | Freq: Every day | ORAL | 1 refills | Status: AC
  Filled 2024-02-14: qty 60, 30d supply, fill #0
  Filled 2024-03-19: qty 60, 30d supply, fill #1
  Filled 2024-04-16: qty 60, 30d supply, fill #2
  Filled 2024-05-14: qty 60, 30d supply, fill #3
  Filled 2024-06-14 – 2024-06-18 (×4): qty 60, 30d supply, fill #4
  Filled 2024-06-25 (×2): qty 60, 30d supply, fill #0
  Filled 2024-06-25: qty 60, 30d supply, fill #4

## 2024-02-29 ENCOUNTER — Encounter: Payer: Self-pay | Admitting: Gastroenterology

## 2024-03-20 ENCOUNTER — Other Ambulatory Visit (HOSPITAL_BASED_OUTPATIENT_CLINIC_OR_DEPARTMENT_OTHER): Payer: Self-pay

## 2024-04-15 ENCOUNTER — Encounter: Payer: Self-pay | Admitting: Gastroenterology

## 2024-04-16 ENCOUNTER — Other Ambulatory Visit: Payer: Self-pay

## 2024-04-16 ENCOUNTER — Other Ambulatory Visit (HOSPITAL_BASED_OUTPATIENT_CLINIC_OR_DEPARTMENT_OTHER): Payer: Self-pay

## 2024-05-01 ENCOUNTER — Other Ambulatory Visit (HOSPITAL_BASED_OUTPATIENT_CLINIC_OR_DEPARTMENT_OTHER): Payer: Self-pay

## 2024-05-01 ENCOUNTER — Ambulatory Visit (AMBULATORY_SURGERY_CENTER)

## 2024-05-01 ENCOUNTER — Other Ambulatory Visit: Payer: Self-pay

## 2024-05-01 VITALS — Ht 61.0 in | Wt 164.0 lb

## 2024-05-01 DIAGNOSIS — Z860100 Personal history of colon polyps, unspecified: Secondary | ICD-10-CM

## 2024-05-01 MED ORDER — NA SULFATE-K SULFATE-MG SULF 17.5-3.13-1.6 GM/177ML PO SOLN
1.0000 | Freq: Once | ORAL | 0 refills | Status: AC
  Filled 2024-05-01: qty 354, 1d supply, fill #0

## 2024-05-01 NOTE — Progress Notes (Signed)
 PCP MD at time of PV: Alyssa Allwardt, PA-C __________________________________________________________________________________________________________________________________________  No egg allergy known to patient  No soy allergy known to patient No issues known to pt with past sedation with any surgeries or procedures Patient denies ever being told they had issues or difficulty with intubation  No FH of Malignant Hyperthermia Pt is not on diet pills Pt is not on  home 02  Pt is not on blood thinners  No A fib or A flutter Have any cardiac testing pending--no  LOA: independent  No Chew or Snuff tobacco Pt advised not to take GLP1 injection after Tuesday 12/2  __________________________________________________________________________________________________________________________________________  Constipation: no  Prep: suprep  __________________________________________________________________________________________________________________________________________  PV completed with patient. Prep instructions reviewed and provided during apt. Rx sent to preferred pharmacy.  __________________________________________________________________________________________________________________________________________  Patient's chart reviewed by Norleen Schillings CNRA prior to previsit and patient appropriate for the [facility].  Previsit completed and red dot placed by patient's name on their procedure day (on provider's schedule).

## 2024-05-02 ENCOUNTER — Other Ambulatory Visit: Payer: Self-pay | Admitting: Medical Genetics

## 2024-05-03 ENCOUNTER — Other Ambulatory Visit (HOSPITAL_BASED_OUTPATIENT_CLINIC_OR_DEPARTMENT_OTHER): Payer: Self-pay

## 2024-05-15 ENCOUNTER — Encounter: Payer: Self-pay | Admitting: Gastroenterology

## 2024-05-15 ENCOUNTER — Ambulatory Visit: Admitting: Gastroenterology

## 2024-05-15 VITALS — BP 138/76 | HR 54 | Temp 98.2°F | Resp 11 | Ht 61.0 in | Wt 164.0 lb

## 2024-05-15 DIAGNOSIS — K5730 Diverticulosis of large intestine without perforation or abscess without bleeding: Secondary | ICD-10-CM | POA: Diagnosis not present

## 2024-05-15 DIAGNOSIS — Z1211 Encounter for screening for malignant neoplasm of colon: Secondary | ICD-10-CM

## 2024-05-15 DIAGNOSIS — Z860100 Personal history of colon polyps, unspecified: Secondary | ICD-10-CM

## 2024-05-15 DIAGNOSIS — K641 Second degree hemorrhoids: Secondary | ICD-10-CM

## 2024-05-15 DIAGNOSIS — Z860101 Personal history of adenomatous and serrated colon polyps: Secondary | ICD-10-CM | POA: Diagnosis not present

## 2024-05-15 MED ORDER — SODIUM CHLORIDE 0.9 % IV SOLN: 500.0000 mL | Freq: Once | INTRAVENOUS | Status: AC

## 2024-05-15 NOTE — Progress Notes (Signed)
 Report to PACU, RN, vss, BBS= Clear.

## 2024-05-15 NOTE — Patient Instructions (Signed)
 YOU HAD AN ENDOSCOPIC PROCEDURE TODAY AT THE [facility]:   Refer to the procedure report that was given to you for any specific questions about what was found during the examination.  If the procedure report does not answer your questions, please call your gastroenterologist to clarify.  If you requested that your care partner not be given the details of your procedure findings, then the procedure report has been included in a sealed envelope for you to review at your convenience later.  YOU SHOULD EXPECT: Some feelings of bloating in the abdomen. Passage of more gas than usual.  Walking can help get rid of the air that was put into your GI tract during the procedure and reduce the bloating. If you had a lower endoscopy (such as a colonoscopy or flexible sigmoidoscopy) you may notice spotting of blood in your stool or on the toilet paper. If you underwent a bowel prep for your procedure, you may not have a normal bowel movement for a few days.  Please Note:  You might notice some irritation and congestion in your nose or some drainage.  This is from the oxygen used during your procedure.  There is no need for concern and it should clear up in a day or so.  SYMPTOMS TO REPORT IMMEDIATELY:  Following lower endoscopy (colonoscopy or flexible sigmoidoscopy):  Excessive amounts of blood in the stool  Significant tenderness or worsening of abdominal pains  Swelling of the abdomen that is new, acute  Fever of 100F or higher  High fiber diet Use FiberCon  1-2 tablets by mouth daily Continue present medications Repeat colonoscopy in 7 years Handouts on polyps, hemorrhoids and diverticulosis given   For urgent or emergent issues, a gastroenterologist can be reached at any hour by calling [phone]. Do not use MyChart messaging for urgent concerns.    DIET:  We do recommend a small meal at first, but then you may proceed to your regular diet.  Drink plenty of fluids but you should  avoid alcoholic beverages for 24 hours.  ACTIVITY:  You should plan to take it easy for the rest of today and you should NOT DRIVE or use heavy machinery until tomorrow (because of the sedation medicines used during the test).    FOLLOW UP: Our staff will call the number listed on your records the next business day following your procedure.  We will call around 7:15- 8:00 am to check on you and address any questions or concerns that you may have regarding the information given to you following your procedure. If we do not reach you, we will leave a message.     If any biopsies were taken you will be contacted by phone or by letter within the next 1-3 weeks.  Please call us  at [phone] if you have not heard about the biopsies in 3 weeks.    SIGNATURES/CONFIDENTIALITY: You and/or your care partner have signed paperwork which will be entered into your electronic medical record.  These signatures attest to the fact that that the information above on your After Visit Summary has been reviewed and is understood.  Full responsibility of the confidentiality of this discharge information lies with you and/or your care-partner.

## 2024-05-15 NOTE — Progress Notes (Signed)
 Pt's states no medical or surgical changes since previsit or office visit.

## 2024-05-15 NOTE — Progress Notes (Signed)
 GASTROENTEROLOGY PROCEDURE H&P NOTE   Primary Care Physician: Allwardt, Mardy HERO, PA-C  HPI: Gabrielle Gilbert is a 48 y.o. female who presents for Colonoscopy for surveillance of prior adenomas.  Past Medical History:  Diagnosis Date   Abnormal Pap smear    Diverticulitis    Dysmenorrhea    Endometriosis    Fibroid    Gallstones    Heart murmur    History of fainting spells of unknown cause    Hormone disorder    Infertility, female    Migraine with aura    Rosacea    Past Surgical History:  Procedure Laterality Date   ABDOMINAL HYSTERECTOMY     CERVICAL BIOPSY  W/ LOOP ELECTRODE EXCISION     CESAREAN SECTION  12/02/2011   Procedure: CESAREAN SECTION;  Surgeon: Dickie DELENA Carder, MD;  Location: [facility];  Service: Gynecology;  Laterality: N/A;   CHOLECYSTECTOMY     CYSTOSCOPY N/A 05/07/2018   Procedure: CYSTOSCOPY;  Surgeon: Jannis Kate Norris, MD;  Location: [facility];  Service: Gynecology;  Laterality: N/A;   DILATION AND CURETTAGE OF UTERUS     history of leep     1998   HYSTEROSCOPY     LAPAROSCOPIC OVARIAN CYSTECTOMY     LAPAROSCOPY     LEEP     MYOMECTOMY     robotic myomectomy, Dr Johnston    TOTAL LAPAROSCOPIC HYSTERECTOMY WITH SALPINGECTOMY Bilateral 05/07/2018   Procedure: TOTAL LAPAROSCOPIC HYSTERECTOMY WITH SALPINGECTOMY;  Surgeon: Jannis Kate Norris, MD;  Location: [facility];  Service: Gynecology;  Laterality: Bilateral;  extended stay recovery   WISDOM TOOTH EXTRACTION     Current Outpatient Medications  Medication Sig Dispense Refill   Ascorbic Acid (VITAMIN C) 100 MG tablet Take 100 mg by mouth daily.     aspirin 325 MG tablet Take 81 mg by mouth daily. (Patient not taking: Reported on 05/01/2024)     cetirizine (ZYRTEC) 10 MG tablet Take 10 mg by mouth daily.     Cholecalciferol (VITAMIN D -3) 25 MCG (1000 UT) CAPS Take by mouth.     fluocinonide cream (LIDEX) 0.05 % Apply 1 Application topically 2 (two)  times daily. (Patient not taking: Reported on 05/01/2024)     hyoscyamine  (LEVSIN  SL) 0.125 MG SL tablet Place 1 tablet (0.125 mg total) under the tongue every 6 (six) hours as needed. (Patient not taking: Reported on 05/01/2024) 15 tablet 0   magnesium  gluconate (MAGONATE) 500 (27 Mg) MG TABS tablet Take 500 mg by mouth in the morning and at bedtime.     Multiple Vitamin (MULTIVITAMIN) capsule Take 1 capsule by mouth daily. (Patient not taking: Reported on 05/01/2024)     Omega-3 Fatty Acids (FISH OIL PO) Take by mouth.     progesterone  (PROMETRIUM ) 100 MG capsule Take 1-2 capsules (100-200 mg total) by mouth at bedtime. 180 capsule 1   triamcinolone  cream (KENALOG ) 0.1 % Apply 1 Application topically 2 (two) times daily. Do not use longer than 2 weeks consistently. (Patient not taking: Reported on 05/01/2024) 45 g 0   UNABLE TO FIND Inject 10 mg into the skin once a week. Med Name: generic glp 1 thru a pharm company     No current facility-administered medications for this visit.    Current Outpatient Medications:    Ascorbic Acid (VITAMIN C) 100 MG tablet, Take 100 mg by mouth daily., Disp: , Rfl:    aspirin 325 MG tablet, Take 81 mg by mouth daily. (  Patient not taking: Reported on 05/01/2024), Disp: , Rfl:    cetirizine (ZYRTEC) 10 MG tablet, Take 10 mg by mouth daily., Disp: , Rfl:    Cholecalciferol (VITAMIN D -3) 25 MCG (1000 UT) CAPS, Take by mouth., Disp: , Rfl:    fluocinonide cream (LIDEX) 0.05 %, Apply 1 Application topically 2 (two) times daily. (Patient not taking: Reported on 05/01/2024), Disp: , Rfl:    hyoscyamine  (LEVSIN  SL) 0.125 MG SL tablet, Place 1 tablet (0.125 mg total) under the tongue every 6 (six) hours as needed. (Patient not taking: Reported on 05/01/2024), Disp: 15 tablet, Rfl: 0   magnesium  gluconate (MAGONATE) 500 (27 Mg) MG TABS tablet, Take 500 mg by mouth in the morning and at bedtime., Disp: , Rfl:    Multiple Vitamin (MULTIVITAMIN) capsule, Take 1 capsule by  mouth daily. (Patient not taking: Reported on 05/01/2024), Disp: , Rfl:    Omega-3 Fatty Acids (FISH OIL PO), Take by mouth., Disp: , Rfl:    progesterone  (PROMETRIUM ) 100 MG capsule, Take 1-2 capsules (100-200 mg total) by mouth at bedtime., Disp: 180 capsule, Rfl: 1   triamcinolone  cream (KENALOG ) 0.1 %, Apply 1 Application topically 2 (two) times daily. Do not use longer than 2 weeks consistently. (Patient not taking: Reported on 05/01/2024), Disp: 45 g, Rfl: 0   UNABLE TO FIND, Inject 10 mg into the skin once a week. Med Name: generic glp 1 thru a pharm company, Disp: , Rfl:  Allergies  Allergen Reactions   Bio-35 Gluten-Free [Actical]    Family History  Problem Relation Age of Onset   Hyperlipidemia Mother    Heart disease Mother    Hypertension Mother    Fibroids Mother    Atrial fibrillation Mother    Hyperlipidemia Father    Heart disease Father    Hypertension Father    CAD Father    Valvular heart disease Father    Heart disease Sister    Valvular heart disease Sister    Colon cancer Maternal Uncle    Colon cancer Paternal Grandmother    Heart attack Paternal Grandfather    Congenital heart disease Daughter    Anesthesia problems Neg Hx    Esophageal cancer Neg Hx    Stomach cancer Neg Hx    Rectal cancer Neg Hx    Social History   Socioeconomic History   Marital status: Married    Spouse name: Not on file   Number of children: Not on file   Years of education: Not on file   Highest education level: Not on file  Occupational History   Not on file  Tobacco Use   Smoking status: Never   Smokeless tobacco: Never  Vaping Use   Vaping status: Never Used  Substance and Sexual Activity   Alcohol use: Yes    Alcohol/week: 3.0 standard drinks of alcohol    Types: 3 Standard drinks or equivalent per week   Drug use: No   Sexual activity: Yes    Birth control/protection: Surgical    Comment: hysterecomy  Other Topics Concern   Not on file  Social History  Narrative   Not on file   Social Drivers of Health   Financial Resource Strain: Not on file  Food Insecurity: Not on file  Transportation Needs: Not on file  Physical Activity: Not on file  Stress: Not on file  Social Connections: Not on file  Intimate Partner Violence: Not on file    Physical Exam: There were no vitals filed  for this visit. There is no height or weight on file to calculate BMI. GEN: NAD EYE: Sclerae anicteric ENT: MMM CV: Non-tachycardic GI: Soft, NT/ND NEURO:  Alert & Oriented x 3  Lab Results: No results for input(s): WBC, HGB, HCT, PLT in the last 72 hours. BMET No results for input(s): NA, K, CL, CO2, GLUCOSE, BUN, CREATININE, CALCIUM in the last 72 hours. LFT No results for input(s): PROT, ALBUMIN, AST, ALT, ALKPHOS, BILITOT, BILIDIR, IBILI in the last 72 hours. PT/INR No results for input(s): LABPROT, INR in the last 72 hours.   Impression / Plan: This is a 48 y.o.female who presents for Colonoscopy for surveillance of prior adenomas.  The risks and benefits of endoscopic evaluation/treatment were discussed with the patient and/or family; these include but are not limited to the risk of perforation, infection, bleeding, missed lesions, lack of diagnosis, severe illness requiring hospitalization, as well as anesthesia and sedation related illnesses.  The patient's history has been reviewed, patient examined, no change in status, and deemed stable for procedure.  The patient and/or family was provided an opportunity to ask questions and all were answered.  The patient and/or family is agreeable to proceed.    Aloha Finner, MD [facility] Advanced [facility]oscopy Office # [phone]

## 2024-05-15 NOTE — Op Note (Addendum)
 [facility] Patient Name: Gabrielle Gilbert Procedure Date: 05/15/2024 2:09 PM MRN: [id number] Endoscopist: Aloha Finner , MD, [id number] Age: 48 Referring MD:  Date of Birth: 1975-08-29 Gender: Female Account #: [id number] Procedure:                Colonoscopy Indications:              High risk colon cancer surveillance: Personal                            history of adenoma less than 10 mm in size Medicines:                Monitored Anesthesia Care Procedure:                Pre-Anesthesia Assessment:                           - Prior to the procedure, a History and Physical                            was performed, and patient medications and                            allergies were reviewed. The patient's tolerance of                            previous anesthesia was also reviewed. The risks                            and benefits of the procedure and the sedation                            options and risks were discussed with the patient.                            All questions were answered, and informed consent                            was obtained. Prior Anticoagulants: The patient has                            taken no anticoagulant or antiplatelet agents                            except for aspirin. ASA Grade Assessment: II - A                            patient with mild systemic disease. After reviewing                            the risks and benefits, the patient was deemed in                            satisfactory condition to undergo the procedure.  After obtaining informed consent, the colonoscope                            was passed under direct vision. Throughout the                            procedure, the patient's blood pressure, pulse, and                            oxygen saturations were monitored continuously. The                            CF [id number] #[id number] was introduced through the anus                             and advanced to the the cecum, identified by                            appendiceal orifice and ileocecal valve. The                            colonoscopy was performed without difficulty. The                            patient tolerated the procedure. The quality of the                            bowel preparation was adequate. The ileocecal                            valve, appendiceal orifice, and rectum were                            photographed. Scope In: 2:17:53 PM Scope Out: 2:28:12 PM Scope Withdrawal Time: 0 hours 7 minutes 16 seconds  Total Procedure Duration: 0 hours 10 minutes 19 seconds  Findings:                 The digital rectal exam findings include                            hemorrhoids. Pertinent negatives include no                            palpable rectal lesions.                           Multiple small-mouthed diverticula were found in                            the recto-sigmoid colon, sigmoid colon, descending                            colon and transverse colon.  Normal mucosa was found in the entire colon.                           Non-bleeding non-thrombosed external and internal                            hemorrhoids were found during retroflexion, during                            perianal exam and during digital exam. The                            hemorrhoids were Grade II (internal hemorrhoids                            that prolapse but reduce spontaneously). Complications:            No immediate complications. Estimated Blood Loss:     Estimated blood loss was minimal. Estimated blood                            loss: none. Impression:               - Hemorrhoids found on digital rectal exam.                           - Diverticulosis in the recto-sigmoid colon, in the                            sigmoid colon, in the descending colon and in the                            transverse colon.                            - Normal mucosa in the entire examined colon.                           - Non-bleeding non-thrombosed external and internal                            hemorrhoids. Recommendation:           - The patient will be observed post-procedure,                            until all discharge criteria are met.                           - Discharge patient to home.                           - Patient has a contact number available for                            emergencies. The signs and symptoms of potential  delayed complications were discussed with the                            patient. Return to normal activities tomorrow.                            Written discharge instructions were provided to the                            patient.                           - High fiber diet.                           - Use FiberCon 1-2 tablets PO daily.                           - Continue present medications.                           - Repeat colonoscopy in 7 years for surveillance                            due to history of previous adenomatous polyps.                           - The findings and recommendations were discussed                            with the patient.                           - The findings and recommendations were discussed                            with the patient's family. Aloha Finner, MD 05/15/2024 2:32:01 PM

## 2024-05-16 ENCOUNTER — Other Ambulatory Visit

## 2024-05-16 ENCOUNTER — Telehealth: Payer: Self-pay | Admitting: Lactation Services

## 2024-05-16 DIAGNOSIS — Z006 Encounter for examination for normal comparison and control in clinical research program: Secondary | ICD-10-CM

## 2024-05-16 NOTE — Telephone Encounter (Signed)
°  Follow up Call-     05/15/2024    1:11 PM  Call back number  Post procedure Call Back phone  # [phone]  Permission to leave phone message Yes     Patient questions:  Do you have a fever, pain , or abdominal swelling? No. Pain Score  0 *  Have you tolerated food without any problems? Yes.    Have you been able to return to your normal activities? Yes.    Do you have any questions about your discharge instructions: Diet   No. Medications  No. Follow up visit  No.  Do you have questions or concerns about your Care? No.  Actions: * If pain score is 4 or above: No action needed, pain <4.

## 2024-05-26 LAB — GENECONNECT MOLECULAR SCREEN

## 2024-06-15 ENCOUNTER — Other Ambulatory Visit (HOSPITAL_BASED_OUTPATIENT_CLINIC_OR_DEPARTMENT_OTHER): Payer: Self-pay

## 2024-06-18 ENCOUNTER — Other Ambulatory Visit: Payer: Self-pay | Admitting: Physician Assistant

## 2024-06-18 ENCOUNTER — Other Ambulatory Visit (HOSPITAL_BASED_OUTPATIENT_CLINIC_OR_DEPARTMENT_OTHER): Payer: Self-pay

## 2024-06-18 DIAGNOSIS — Z1231 Encounter for screening mammogram for malignant neoplasm of breast: Secondary | ICD-10-CM

## 2024-06-25 ENCOUNTER — Other Ambulatory Visit (HOSPITAL_BASED_OUTPATIENT_CLINIC_OR_DEPARTMENT_OTHER): Payer: Self-pay

## 2024-06-25 ENCOUNTER — Other Ambulatory Visit: Payer: Self-pay

## 2024-06-26 ENCOUNTER — Other Ambulatory Visit (HOSPITAL_BASED_OUTPATIENT_CLINIC_OR_DEPARTMENT_OTHER): Payer: Self-pay

## 2024-06-27 ENCOUNTER — Ambulatory Visit
Admission: RE | Admit: 2024-06-27 | Discharge: 2024-06-27 | Disposition: A | Discharge status: Home / Self Care | Source: Ambulatory Visit | Attending: Physician Assistant | Admitting: Physician Assistant

## 2024-06-27 DIAGNOSIS — Z1231 Encounter for screening mammogram for malignant neoplasm of breast: Secondary | ICD-10-CM

## 2024-07-05 ENCOUNTER — Ambulatory Visit: Payer: Self-pay | Admitting: Physician Assistant

## 2024-07-09 ENCOUNTER — Other Ambulatory Visit: Payer: Self-pay | Admitting: Physician Assistant

## 2024-07-09 DIAGNOSIS — R928 Other abnormal and inconclusive findings on diagnostic imaging of breast: Secondary | ICD-10-CM

## 2024-07-15 ENCOUNTER — Encounter

## 2024-07-15 ENCOUNTER — Other Ambulatory Visit

## 2024-09-02 ENCOUNTER — Encounter: Admitting: Physician Assistant

## 2024-09-10 ENCOUNTER — Encounter: Admitting: Physician Assistant
